# Patient Record
Sex: Male | Born: 1980 | Race: White | Hispanic: No | Smoking: Never smoker
Health system: Southern US, Community
[De-identification: ages and names within clinical notes are randomized; demographics above are authoritative.]

## PROBLEM LIST (undated history)

## (undated) DIAGNOSIS — I10 Essential (primary) hypertension: Secondary | ICD-10-CM

## (undated) HISTORY — DX: Essential (primary) hypertension: I10

---

## 2013-09-17 ENCOUNTER — Encounter (INDEPENDENT_AMBULATORY_CARE_PROVIDER_SITE_OTHER): Payer: Self-pay

## 2013-09-17 ENCOUNTER — Encounter (HOSPITAL_COMMUNITY): Payer: Self-pay | Admitting: Physician Assistant

## 2013-09-17 ENCOUNTER — Ambulatory Visit (INDEPENDENT_AMBULATORY_CARE_PROVIDER_SITE_OTHER): Payer: 59 | Admitting: Physician Assistant

## 2013-09-17 VITALS — BP 150/90 | HR 90 | Ht 74.0 in | Wt 249.0 lb

## 2013-09-17 DIAGNOSIS — F411 Generalized anxiety disorder: Secondary | ICD-10-CM

## 2013-09-17 MED ORDER — BUSPIRONE HCL 30 MG PO TABS: 30.0000 mg | ORAL_TABLET | Freq: Three times a day (TID) | ORAL | Status: DC

## 2013-09-17 MED ORDER — LORAZEPAM 0.5 MG PO TABS: 0.5000 mg | ORAL_TABLET | Freq: Three times a day (TID) | ORAL | Status: DC

## 2013-09-17 NOTE — Patient Instructions (Signed)
Schedule with PCP recommend Dr. Karie Schwalbe. Schedule with Mordecai RasmussenHannah Coble for therapy. Avoid all alcohol. Get daily exercise. Plenty of rest. Follow up in 2-3 weeks.

## 2013-09-17 NOTE — Progress Notes (Signed)
Psychiatric Assessment Adult  Patient Identification:  Juan Mullins Date of Evaluation:  09/17/2013 Chief Complaint: anxiety History of Chief Complaint:   Chief Complaint  Patient presents with  . 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 He states his problems began when he and his wife divorced. They continued to see one and other, and eventually remarried.  He found that she had been prostituting. She confessed and he confronted her about continuing to see a particular client.  They are currently separated a second time and planning to divorce. He also notes that his father has recently been diagnosed with a cancer and is taking chemo therapy. His mother is mentally ill and he hasn't spoken to her in some time.     Juan Mullins notes that he is overwhelmed, anxious, having panic attacks that cause him to be distracted at work. He states he is drinking more to help with the anxiety and does not want to continue this way. HPI Review of Systems  Constitutional: Positive for diaphoresis and unexpected weight change.  HENT: Negative.   Eyes: Negative.   Respiratory: Negative.   Gastrointestinal: Negative.   Endocrine: Negative.   Genitourinary:       Patient has not been checked for STD's and is now having some ED as a result of the stress.  Musculoskeletal: Negative.   Skin: Negative.   Allergic/Immunologic: Negative.   Neurological: Negative.   Hematological: Negative.   Psychiatric/Behavioral: Positive for sleep disturbance, decreased concentration and agitation. The patient is nervous/anxious.    Physical Exam  Psychiatric: His speech is normal and behavior is normal. Judgment and thought content  normal. His mood appears anxious. His affect is angry. Cognition and memory are normal.  Patient is seen.    Depressive Symptoms: anhedonia, insomnia, psychomotor agitation, feelings of worthlessness/guilt, difficulty concentrating, anxiety, panic attacks, weight gain, increased appetite,  (Hypo) Manic Symptoms:   Elevated Mood:  No Irritable Mood:  Yes Grandiosity:  No Distractibility:  Yes Labiality of Mood:  No Delusions:  No Hallucinations:  No Impulsivity:  No Sexually Inappropriate Behavior:  No Financial Extravagance:  No Flight of Ideas:  No  Anxiety Symptoms: Excessive Worry:  Yes Panic Symptoms:  Yes Agoraphobia:  No Obsessive Compulsive: No  Symptoms: None, Specific Phobias:  No Social Anxiety:  No  Psychotic Symptoms:  Hallucinations: No None Delusions:  No Paranoia:  No   Ideas of Reference:  No  PTSD Symptoms: Ever had a traumatic exposure:  No Had a traumatic exposure in the last month:  No Re-experiencing: No None Hypervigilance:  No Hyperarousal: No Difficulty Concentrating Irritability/Anger Avoidance: Yes Decreased Interest/Participation  Traumatic Brain Injury: No   Past Psychiatric History: Diagnosis: None  Hospitalizations:   Outpatient Care:   Substance Abuse Care:   Self-Mutilation: none  Suicidal Attempts:   none  Violent Behaviors:   none   Past Medical History:  History reviewed. No pertinent past medical history. History of Loss of Consciousness:  No Seizure History:  No Cardiac History:  No Allergies:  No Known Allergies Current Medications:  No current outpatient prescriptions on file.   No current facility-administered medications for this visit.    Previous Psychotropic Medications:  Medication Dose  adderall  30mg  but stopped due to side effects                     Substance Abuse History in the last 12 months: Medical Consequences of Substance Abuse:   Legal Consequences of Substance Abuse:    Family Consequences of Substance Abuse:  Blackouts:  No DT's:  No Withdrawal Symptoms:  No None  Social History: [place] Family Members: Parents are alive, 1 sister, 1/2 sister,  Marital Status:  Separated Children: 2 sons  Sons:   Daughters:  Relationships: single Education:  [employer] Educational Problems/Performance: Religious Beliefs/Practices: Christian History of Abuse: none Teacher, musicccupational Experiences; 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 No pertinent family history.  Mental Status Examination/Evaluation: Objective:  Appearance: Fairly Groomed  Eye Contact::  Good  Speech:  Clear and Coherent  Volume:  Normal  Mood:  euthymic  Affect:  Congruent  Thought Process:  Goal Directed  Orientation:  Full (Time, Place, and Person)  Thought Content:  WDL  Suicidal Thoughts:  No  Homicidal Thoughts:  No  Judgement:  Intact  Insight:  Present  Psychomotor Activity:  Normal  Akathisia:  No  Handed:  Right  AIMS (if indicated):    Assets:  Communication Skills Desire for Improvement Financial Resources/Insurance Housing Physical Health Resilience Social Support Talents/Skills Transportation Vocational/Educational    Laboratory/X-Ray Psychological Evaluation(s)   Patient will need labs.     Assessment:  Generalized anxiety disorder  AXIS I Anxiety Disorder NOS  AXIS II Deferred  AXIS III History reviewed. No pertinent past medical history.   AXIS IV problems related to social environment  AXIS V 51-60 moderate symptoms   Treatment Plan/Recommendations:  Plan of Care:  Medication management, OPT, establish with PCP, GC/CZ  Laboratory:  CBC Chemistry Profile UDS GC/CZ  Psychotherapy: individual  Medications: Ativan .5mg  , Buspar 10mg  po BID  Routine PRN Medications:  No  Consultations: if  needed  Safety Concerns:  none  Other:     Lloyd HugerNeil T. Mashburn RPAC 9:18 AM 09/17/2013

## 2013-09-18 ENCOUNTER — Telehealth (HOSPITAL_COMMUNITY): Payer: Self-pay

## 2013-09-19 NOTE — Telephone Encounter (Signed)
I called the patient back regarding his anxiety and his prescriptions. He was taking 30mg  of Buspar TID. He is advised to only take one tablet a day.  He is also asked to take 2 of his Ativan 0.5mg  to see how this helps his anxiety.  Juan Mullins is also cautioned to take someone with him when he exchanges his children with his ex-wife and to avoid all confrontation with her during the exchange.  He will follow up as planned.  Rona RavensNeil T. Mashburn RPAC 9:39 AM 09/19/2013

## 2013-09-20 ENCOUNTER — Other Ambulatory Visit (HOSPITAL_COMMUNITY): Payer: Self-pay | Admitting: Physician Assistant

## 2013-09-20 ENCOUNTER — Encounter: Payer: Self-pay | Admitting: Family Medicine

## 2013-09-20 ENCOUNTER — Ambulatory Visit (INDEPENDENT_AMBULATORY_CARE_PROVIDER_SITE_OTHER): Payer: 59 | Admitting: Family Medicine

## 2013-09-20 ENCOUNTER — Telehealth (HOSPITAL_COMMUNITY): Payer: Self-pay

## 2013-09-20 VITALS — BP 153/96 | HR 94 | Ht 74.0 in | Wt 249.0 lb

## 2013-09-20 DIAGNOSIS — I10 Essential (primary) hypertension: Secondary | ICD-10-CM

## 2013-09-20 DIAGNOSIS — E669 Obesity, unspecified: Secondary | ICD-10-CM

## 2013-09-20 DIAGNOSIS — Z202 Contact with and (suspected) exposure to infections with a predominantly sexual mode of transmission: Secondary | ICD-10-CM

## 2013-09-20 DIAGNOSIS — F524 Premature ejaculation: Secondary | ICD-10-CM

## 2013-09-20 DIAGNOSIS — Z13220 Encounter for screening for lipoid disorders: Secondary | ICD-10-CM

## 2013-09-20 HISTORY — DX: Essential (primary) hypertension: I10

## 2013-09-20 MED ORDER — PROPRANOLOL HCL 20 MG PO TABS: 20.0000 mg | ORAL_TABLET | Freq: Two times a day (BID) | ORAL | Status: DC

## 2013-09-20 MED ORDER — TOPIRAMATE 50 MG PO TABS: 50.0000 mg | ORAL_TABLET | Freq: Two times a day (BID) | ORAL | Status: DC

## 2013-09-20 NOTE — Progress Notes (Signed)
CC: Juan Mullins is a 33 y.o. male is here for Establish Care   Subjective: HPI:  Pleasant 33 year old here to establish care  Patient reports that his wife who he is currently in the process of divorcing has been sleeping with other partners recently. He believes that he is at risk of being exposed to STD. Just prior to today's visit he had STD testing downstairs in our lab, results are not currently available at this time. He does not report any specific symptoms attributed to STDs however does report long-standing concerns regarding premature ejaculation. He states that he predictably reaches orgasm and ejaculation within 1 minute after any sexual stimulation. He has tried Zoloft for this however it only provided 3-4 minutes of prolonging ejaculation from the time of sexual stimulation.  No other interventions as of yet. Symptoms are described as moderate to severe in severity and present on a daily basis regardless of who is providing the sexual stimulation.  Patient reports a history of anxiety which he has been trying to control with compulsive stress eating.  Symptoms are present on a daily basis. Nothing particularly makes symptoms better despite starting BuSpar and Ativan last week. No formal exercise routine. He has had unintentional weight gain polyuria basis for the past 2-3 years.  He has no outside blood pressures to report. He states that he believes that his blood pressures have been normotensive when checked last many months ago. Unknown quantitative amount of sodium on a daily basis. He does not take any stimulants or decongestants.  Review of Systems - General ROS: negative for - chills, fever, night sweats, or weight loss Ophthalmic ROS: negative for - decreased vision Psychological ROS: negative for -  depression ENT ROS: negative for - hearing change, nasal congestion, tinnitus or allergies Hematological and Lymphatic ROS: negative for - bleeding problems, bruising or swollen  lymph nodes Breast ROS: negative Respiratory ROS: no cough, shortness of breath, or wheezing Cardiovascular ROS: no chest pain or dyspnea on exertion Gastrointestinal ROS: no abdominal pain, change in bowel habits, or black or bloody stools Genito-Urinary ROS: negative for - genital discharge, genital ulcers, incontinence or abnormal bleeding from genitals Musculoskeletal ROS: negative for - joint pain or muscle pain Neurological ROS: negative for - headaches or memory loss Dermatological ROS: negative for lumps, mole changes, rash and skin lesion changes  History reviewed. No pertinent past medical history.  History reviewed. No pertinent past surgical history. Family History  Problem Relation Age of Onset  . Squamous cell carcinoma      History   Social History  . Marital Status: Legally Separated    Spouse Name: N/A    Number of Children: N/A  . Years of Education: N/A   Occupational History  . Not on file.   Social History Main Topics  . Smoking status: Never Smoker   . Smokeless tobacco: Not on file  . Alcohol Use: 10.0 oz/week    20 drink(s) per week  . Drug Use: No  . Sexual Activity: Yes    Partners: Female   Other Topics Concern  . Not on file   Social History Narrative  . No narrative on file     Objective: BP 153/96  Pulse 94  Ht 6\' 2"  (1.88 m)  Wt 249 lb (112.946 kg)  BMI 31.96 kg/m2  General: Alert and Oriented, No Acute Distress HEENT: Pupils equal, round, reactive to light. Conjunctivae clear. Moist mucous membranes pharynx unremarkable Lungs: Clear to auscultation bilaterally, no wheezing/ronchi/rales.  Comfortable work of breathing. Good air movement. Cardiac: Regular rate and rhythm. Normal S1/S2.  No murmurs, rubs, nor gallops.   Abdomen: Obese soft nontender Extremities: No peripheral edema.  Strong peripheral pulses.  Mental Status: No depression, anxiety, nor agitation. Skin: Warm and dry.  Assessment & Plan: Juan Mullins was seen today for  establish care.  Diagnoses and associated orders for this visit:  Essential hypertension, benign - propranolol (INDERAL) 20 MG tablet; Take 1-2 tablets (20-40 mg total) by mouth 2 (two) times daily.  Possible exposure to STD  Obesity - topiramate (TOPAMAX) 50 MG tablet; Take 1 tablet (50 mg total) by mouth 2 (two) times daily.  Lipid screening - Lipid panel  Premature ejaculation    Premature ejaculation: Discussed that he is switched to a SSRI/SNRI by his psychiatrist this will likely help. Beta blockers are known to cause sexual side effects such as relative impotence, hopefully started on propranolol this will help prolong the time between initiation of sexual stimulation to ejaculation Possible exposure to STD: Labs are pending Obesity: He is not a candidate for phentermine due to anxiety and uncontrolled blood pressure, we will try Topamax Essential hypertension: Uncontrolled chronic condition starting propranolol we'll titrate as needed Due for annual dyslipidemia screening  Return in about 4 weeks (around 10/18/2013) for BP.

## 2013-09-23 ENCOUNTER — Telehealth (HOSPITAL_COMMUNITY): Payer: Self-pay

## 2013-09-25 ENCOUNTER — Telehealth (HOSPITAL_COMMUNITY): Payer: Self-pay

## 2013-09-25 ENCOUNTER — Encounter (HOSPITAL_COMMUNITY): Payer: Self-pay | Admitting: Psychiatry

## 2013-09-25 ENCOUNTER — Ambulatory Visit (INDEPENDENT_AMBULATORY_CARE_PROVIDER_SITE_OTHER): Payer: 59 | Admitting: Psychiatry

## 2013-09-25 VITALS — BP 107/76 | HR 61 | Ht 74.0 in | Wt 249.0 lb

## 2013-09-25 DIAGNOSIS — F4323 Adjustment disorder with mixed anxiety and depressed mood: Secondary | ICD-10-CM

## 2013-09-25 DIAGNOSIS — F411 Generalized anxiety disorder: Secondary | ICD-10-CM

## 2013-09-25 MED ORDER — CLONAZEPAM 0.5 MG PO TABS: 0.5000 mg | ORAL_TABLET | Freq: Every day | ORAL | Status: DC

## 2013-09-25 MED ORDER — BUPROPION HCL ER (SR) 150 MG PO TB12: 150.0000 mg | ORAL_TABLET | Freq: Every day | ORAL | Status: DC

## 2013-09-25 NOTE — Progress Notes (Signed)
Patient ID: Juan SilviusJustin Fenster, male   DOB: Mar 21, 1981, 33 y.o.   MRN: [id number] [facility]  Juan Mullins [id number] 33 y.o.  09/25/2013  Chief Complaint: Anxiety and depression     History of Present Illness:   Patient returns for Medication Follow up and is diagnosed with Anxiety possible Generalized anxiety disorder. Patient was recently started on Ativan and buspirone. He felt the medications have not been working he was seen by Lloyd HugerNeil but wants to change to provider for which she is made an appointment with me.  He endorses having racing thoughts getting frustrated easily disturbed sleep at night feeling anxious and panicky and sweating at night.  Aggravating factors separation of his life. He keeps his kids over the weekend age 527 and 1033. It is difficult for him to see his wife dating anybody else. He states that they grew apart and they were married young. Next  Modifying factors. Promotion to a managerial level. This may lead [place] to go [place].  Context. Is also premature ejaculation. He was started on Zoloft in the past which did not help. Started on Inderal which apparently also is not helping him. He feels anxious when he has to perform. He was to be on some medication does not cause further side effects.  Severity of anxiety and depression 5/10  Duration 3-4 years. He wants to me on med that will be beneficial to sexual side effects and not cause weight gain either. No past medical history on file. Family History  Problem Relation Age of Onset  . Squamous cell carcinoma      Outpatient Encounter Prescriptions as of 09/25/2013  Medication Sig  . buPROPion (WELLBUTRIN SR) 150 MG 12 hr tablet Take 1 tablet (150 mg total) by mouth daily.  . clonazePAM (KLONOPIN) 0.5 MG tablet Take 1 tablet (0.5 mg total) by mouth at bedtime.  . propranolol (INDERAL) 20 MG tablet Take 1-2 tablets (20-40 mg total) by mouth 2 (two) times daily.  Marland Kitchen. topiramate  (TOPAMAX) 50 MG tablet Take 1 tablet (50 mg total) by mouth 2 (two) times daily.  . [DISCONTINUED] busPIRone (BUSPAR) 30 MG tablet Take 10 mg by mouth 3 (three) times daily.  . [DISCONTINUED] LORazepam (ATIVAN) 0.5 MG tablet Take 1 tablet (0.5 mg total) by mouth every 8 (eight) hours.    No results found for this or any previous visit (from the past 2160 hour(s)).  BP 107/76  Pulse 61  Ht 6\' 2"  (1.88 m)  Wt 249 lb (112.946 kg)  BMI 31.96 kg/m2   Review of Systems  Constitutional: Negative.   HENT: Negative.   Gastrointestinal: Negative for heartburn and nausea.  Skin: Negative.   Psychiatric/Behavioral: Positive for depression. The patient is nervous/anxious and has insomnia.     Mental Status Examination  Appearance: casual Alert: Yes Attention: fair  Cooperative: Yes Eye Contact: Fair Speech: normal tone Psychomotor Activity: Decreased Memory/Concentration: adequate Oriented: person, place, time/date and situation Mood: Anxious and Dysphoric Affect: Constricted Thought Processes and Associations: Linear Fund of Knowledge: Fair Thought Content: Suicidal ideation and Homicidal ideation were denied Insight: Fair Judgement: Fair  Diagnosis:  generalized anxiety disorder. Adjustment disorder with depressed mood anxiety   Treatment Plan:    discontinue Ativan discontinue BuSpar. Start on Wellbutrin 1 mg SR discussed the side effects with possible increase in anxiety in the beginning for which I will start him on Klonopin at night small little 0.5 mg on a temporary basis. I do recommend lowering  the dose of Inderal as it can cause depression. I do recommend that he stops drinking he has been drinking 4-5 beers every night or every other night informed the medications take 2-4 weeks to build up and they will not work unless he stops drinking.  Pertinent Labs and Relevant Prior Notes reviewed. Medication Side effects, benefits and risks reviewed/discussed with Patient. Time  given for patient to respond and asks questions regarding the Diagnosis and Medications. Safety concerns and to report to ER if suicidal or call 911. Relevant Medications refilled or called in to pharmacy. Discussed weight maintenance and Sleep Hygiene. Follow up with Primary care provider in regards to Medical conditions. Recommend compliance with medications and follow up office appointments. Discussed to avail opportunity to consider or/and continue Individual therapy with Counselor. Greater than 50% of time was spend in counseling and coordination of care with the patient.  Schedule for Follow up visit in  4 weeks  or call in earlier as necessary.  Thresa RossAKHTAR, Carole Doner, MD 09/25/2013

## 2013-09-26 ENCOUNTER — Telehealth (HOSPITAL_COMMUNITY): Payer: Self-pay

## 2013-09-26 DIAGNOSIS — F411 Generalized anxiety disorder: Secondary | ICD-10-CM

## 2013-09-26 DIAGNOSIS — Z7251 High risk heterosexual behavior: Secondary | ICD-10-CM

## 2013-09-26 NOTE — Telephone Encounter (Signed)
Called home there was answering machine. When he calls back need to know what results is he inquiring as we didn't order any labs or if primary care would need to call him back.

## 2013-09-26 NOTE — Telephone Encounter (Signed)
Checked and found that last orders were not put right. Talked with [facility] they would need new orders and the patient to come back in. Verne SpurrNeil Mashburn putting new orders. Informed patient. Kim to call him once ordered are placed so he can get the request forms.

## 2013-10-21 ENCOUNTER — Ambulatory Visit: Payer: Self-pay | Admitting: Family Medicine

## 2013-12-04 ENCOUNTER — Other Ambulatory Visit: Payer: Self-pay | Admitting: Family Medicine

## 2014-10-31 ENCOUNTER — Encounter (HOSPITAL_COMMUNITY): Payer: Self-pay | Admitting: Psychiatry

## 2014-10-31 ENCOUNTER — Ambulatory Visit (INDEPENDENT_AMBULATORY_CARE_PROVIDER_SITE_OTHER): Payer: 59 | Admitting: Psychiatry

## 2014-10-31 VITALS — BP 124/84 | HR 80 | Ht 74.0 in | Wt 224.0 lb

## 2014-10-31 DIAGNOSIS — G4700 Insomnia, unspecified: Secondary | ICD-10-CM | POA: Diagnosis not present

## 2014-10-31 DIAGNOSIS — F900 Attention-deficit hyperactivity disorder, predominantly inattentive type: Secondary | ICD-10-CM

## 2014-10-31 DIAGNOSIS — F411 Generalized anxiety disorder: Secondary | ICD-10-CM | POA: Diagnosis not present

## 2014-10-31 DIAGNOSIS — F4323 Adjustment disorder with mixed anxiety and depressed mood: Secondary | ICD-10-CM

## 2014-10-31 MED ORDER — ATOMOXETINE HCL 25 MG PO CAPS: 25.0000 mg | ORAL_CAPSULE | Freq: Every day | ORAL | Status: DC

## 2014-10-31 MED ORDER — FLUOXETINE HCL 20 MG PO TABS: 20.0000 mg | ORAL_TABLET | Freq: Every day | ORAL | Status: DC

## 2014-10-31 MED ORDER — CLONAZEPAM 1 MG PO TABS: 1.0000 mg | ORAL_TABLET | Freq: Every day | ORAL | Status: DC | PRN

## 2014-10-31 NOTE — Progress Notes (Signed)
Patient ID: Juan Mullins, male   DOB: [birth year], 34 y.o.   MRN: [id number] [facility] Re Establish care after one year  Juan Mullins [id number] 34 y.o.  10/31/2014  Chief Complaint: Anxiety and ADD.      History of Present Illness:   Patient  Is a 34 years old currently single Caucasian male. He is returning after 1 year. He has been diagnosed. Generalized anxiety disorder.  Says that they moved to [place] and he is now divorced he is now coming back and live in [place]. He has been diagnosed with ADD with a primary care physician at [facility] also being given medication of Klonopin for anxiety. He has had sleep disturbance when he was on Adderall since it was a strong medication but it was helping his ADD symptoms   States without medication is feeling distracted get overwhelmed easily cannot focus. Cannot do multitasking. Loses history in of thoughts and has difficulty performing his work. He says he would like to get back on some medication that would help his attention but may not be as strong as Adderall . In regarding to anxiety still worries sometimes excessive but says that he does have a girlfriend and is not worried about the relationship as he has been in the past. He worries about his illnesses and his job. His worries can keep him up.  Does not endorse hopelessness helplessness or significant depressive symptoms says that he does feel down. They're both not feeling down on a day-to-day basis   He endorses having racing thoughts getting frustrated easily disturbed sleep at night feeling anxious at night. Takes Klonopin when necessary and that helps him  Aggravating factors separation of his life.now divorced and that problem is settled down. .multi tasking and overwhelm at work Modifying factors. Promotion to a managerial level. Supportive girl friend.   Context. In attentiion, over stress if cannot perform at work. History of pre mature ejaculation as stressSeverity of  anxiety and depression 5/10  Duration 3-4 years.  He wants to me on med that will be beneficial to sexual side effects and not cause weight gain either.  Severity of anxiety: 6/10. 10 being extreme.   No past medical history on file. Family History  Problem Relation Age of Onset  . Squamous cell carcinoma      Outpatient Encounter Prescriptions as of 10/31/2014  Medication Sig  . clonazePAM (KLONOPIN) 1 MG tablet Take 1 tablet (1 mg total) by mouth daily as needed for anxiety.  . [DISCONTINUED] clonazePAM (KLONOPIN) 0.5 MG tablet Take 1 tablet (0.5 mg total) by mouth at bedtime.  . [DISCONTINUED] topiramate (TOPAMAX) 50 MG tablet TAKE 1 TABLET (50 MG TOTAL) BY MOUTH 2 (TWO) TIMES DAILY.  Marland Kitchen atomoxetine (STRATTERA) 25 MG capsule Take 1 capsule (25 mg total) by mouth daily.  Marland Kitchen FLUoxetine (PROZAC) 20 MG tablet Take 1 tablet (20 mg total) by mouth daily.  . [DISCONTINUED] buPROPion (WELLBUTRIN SR) 150 MG 12 hr tablet Take 1 tablet (150 mg total) by mouth daily. (Patient not taking: Reported on 10/31/2014)  . [DISCONTINUED] propranolol (INDERAL) 20 MG tablet Take 1-2 tablets (20-40 mg total) by mouth 2 (two) times daily. (Patient not taking: Reported on 10/31/2014)   No facility-administered encounter medications on file as of 10/31/2014.    No results found for this or any previous visit (from the past 2160 hour(s)).  BP 124/84 mmHg  Pulse 80  Ht 6\' 2"  (1.88 m)  Wt 224 lb (101.606 kg)  BMI 28.75  kg/m2  SpO2 97%   Social History: Single has girl friend. He is recently divorced. Works at a [employer].  Substance Abuse : history of drinking beer 3 to 4 but denies recent use of alcohol or drugs.   Review of Systems  Constitutional: Negative.   HENT: Negative.   Gastrointestinal: Negative for heartburn and nausea.  Skin: Negative.   Psychiatric/Behavioral: Positive for depression. The patient is nervous/anxious and has insomnia.     Mental Status Examination  Appearance:  casual Alert: Yes Attention: fair  Cooperative: Yes Eye Contact: Fair Speech: normal tone Psychomotor Activity: Decreased Memory/Concentration: adequate Oriented: person, place, time/date and situation Mood: anxious  Affect: Constricted Thought Processes and Associations: Linear Fund of Knowledge: Fair Thought Content: Suicidal ideation and Homicidal ideation were denied Insight: Fair Judgement: Fair  Diagnosis:  generalized anxiety disorder. Adjustment disorder with depressed mood anxiety . ADD, inattentive type.insomnia  Treatment Plan:   Avoid or continue to abstain from alcohol Klonopine use sparingly  qd prn. 15 tablets per month Add prozac  for GAD and at time he binges for food. HOld off adderall Start Strattera  qd for ADD if needed can increase. If remains overwhelmed can consdier low dose of adderall or similar medication.   Pertinent Labs and Relevant Prior Notes reviewed. Medication Side effects, benefits and risks reviewed/discussed with Patient. Time given for patient to respond and asks questions regarding the Diagnosis and Medications. Safety concerns and to report to ER if suicidal or call 911. Relevant Medications refilled or called in to pharmacy. Discussed weight maintenance and Sleep Hygiene. Follow up with Primary care provider in regards to Medical conditions. Recommend compliance with medications and follow up office appointments. Discussed to avail opportunity to consider or/and continue Individual therapy with Counselor. Greater than 50% of time was spend in counseling and coordination of care with the patient.  Schedule for Follow up visit in  4 weeks  or call in earlier as necessary.  Thresa Ross, MD 10/31/2014

## 2014-12-09 ENCOUNTER — Ambulatory Visit (INDEPENDENT_AMBULATORY_CARE_PROVIDER_SITE_OTHER): Payer: 59 | Admitting: Psychiatry

## 2014-12-09 ENCOUNTER — Encounter (HOSPITAL_COMMUNITY): Payer: Self-pay | Admitting: Psychiatry

## 2014-12-09 VITALS — BP 128/85 | HR 80 | Ht 74.0 in | Wt 224.0 lb

## 2014-12-09 DIAGNOSIS — F4323 Adjustment disorder with mixed anxiety and depressed mood: Secondary | ICD-10-CM | POA: Diagnosis not present

## 2014-12-09 DIAGNOSIS — F411 Generalized anxiety disorder: Secondary | ICD-10-CM

## 2014-12-09 DIAGNOSIS — G4700 Insomnia, unspecified: Secondary | ICD-10-CM

## 2014-12-09 DIAGNOSIS — F900 Attention-deficit hyperactivity disorder, predominantly inattentive type: Secondary | ICD-10-CM | POA: Diagnosis not present

## 2014-12-09 MED ORDER — AMPHETAMINE-DEXTROAMPHET ER 10 MG PO CP24: ORAL_CAPSULE | ORAL | Status: DC

## 2014-12-09 MED ORDER — CLONAZEPAM 1 MG PO TABS: 1.0000 mg | ORAL_TABLET | Freq: Every day | ORAL | Status: DC | PRN

## 2014-12-09 NOTE — Progress Notes (Signed)
Patient ID: Juan Mullins, male   DOB: 1981-03-11, 34 y.o.   MRN: [id number] [name] Outpatient follow up visit  Juan Mullins [id number] 34 y.o.  12/09/2014  Chief Complaint: Anxiety and ADD.    History of Present Illness:   Patient  Is a 34 years old currently single Caucasian male. He is returning after 1 year. He has been diagnosed. Generalized anxiety disorder.  Initially presented with " that they moved to [place] and he is now divorced he is now coming back and live here. He has been diagnosed with ADD with a primary care [facility] at [facility] also being given medication of Klonopin for anxiety. He has had sleep disturbance when he was on Adderall since it was a strong medication but it was helping his ADD symptoms "  ADD; did not tolerate Strattera. Still distracted and has gone worse I stopped it Anxiety; generalized worries and also performance anxiety. 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 Prozac was also started for depression and anxiety but he was not able tolerate it because he started gaining weight and feeling jittery  Says Adderall has helped him in the past and would like to get back on that He endorses having racing thoughts getting frustrated easily disturbed sleep at night feeling anxious at night. Takes Klonopin when necessary and that helps him  Aggravating factors separation of his life.now divorced and that problem is settled down. .multi tasking and overwhelm at work Modifying factors. Promotion to a managerial level. Supportive girl friend.   Context. In attentiion, over stress if cannot perform at work. History of pre mature ejaculation as stressSeverity of anxiety and depression 5/10  Duration 3-4 years.  He wants to me on med that will be beneficial to sexual side effects and not cause weight gain either.  Severity  of anxiety: 6/10. 10 being extreme.   No past medical history on file. Family History  Problem Relation Age of Onset  . Squamous cell carcinoma      Outpatient Encounter Prescriptions as of 12/09/2014  Medication Sig  . amphetamine-dextroamphetamine (ADDERALL XR) 10 MG 24 hr capsule ER or XR is fine.  . clonazePAM (KLONOPIN) 1 MG tablet Take 1 tablet (1 mg total) by mouth daily as needed for anxiety.  . [DISCONTINUED] atomoxetine (STRATTERA) 25 MG capsule Take 1 capsule (25 mg total) by mouth daily.  . [DISCONTINUED] clonazePAM (KLONOPIN) 1 MG tablet Take 1 tablet (1 mg total) by mouth daily as needed for anxiety.  . [DISCONTINUED] FLUoxetine (PROZAC) 20 MG tablet Take 1 tablet (20 mg total) by mouth daily.   No facility-administered encounter medications on file as of 12/09/2014.    No results found for this or any previous visit (from the past 2160 hour(s)).  BP 128/85 mmHg  Pulse 80  Ht 6\' 2"  (1.88 m)  Wt 224 lb (101.606 kg)  BMI 28.75 kg/m2   Social History: Single has girl friend. He is recently divorced. Works at a Careers adviser.  Substance Abuse : history of drinking beer 3 to 4 but denies recent use of alcohol or drugs.   Review of Systems  Constitutional: Negative.   Gastrointestinal: Negative for heartburn and nausea.  Skin: Negative for rash.  Neurological: Negative for tremors.  Psychiatric/Behavioral: The patient is nervous/anxious and has insomnia.     Mental Status Examination  Appearance: casual Alert: Yes Attention: fair  Cooperative: Yes Eye Contact: Fair Speech: normal tone Psychomotor Activity: Decreased Memory/Concentration: adequate Oriented: person, place, time/date and situation Mood: somewhat anxious about tasks Affect: Constricted Thought Processes and Associations: Linear Fund of Knowledge: Fair Thought Content: Suicidal ideation and Homicidal ideation were denied Insight: Fair Judgement: Fair  Diagnosis:  generalized anxiety  disorder. Adjustment disorder with depressed mood anxiety . ADD, inattentive type.insomnia  Treatment Plan:   Avoid or continue to abstain from alcohol Klonopine use sparingly  qd prn. Cut down to 10  tablets per month \. Strattera was stopped Start Adderall XR 10 mg for ADHD Continue Klonopin small dose Stranded just to sleep and we discussed an review sleep hygiene   Pertinent Labs and Relevant Prior Notes reviewed. Medication Side effects, benefits and risks reviewed/discussed with Patient. Time given for patient to respond and asks questions regarding the Diagnosis and Medications. Safety concerns and to report to ER if suicidal or call 911. Relevant Medications refilled or called in to pharmacy. Discussed weight maintenance and Sleep Hygiene. Follow up with Primary care provider in regards to Medical conditions. Recommend compliance with medications and follow up office appointments. Discussed to avail opportunity to consider or/and continue Individual therapy with Counselor. Greater than 50% of time was spend in counseling and coordination of care with the patient.  Schedule for Follow up visit in  4 weeks  or call in earlier as necessary.  Time spent: 25 minutes  Thresa Ross, MD 12/09/2014

## 2015-01-05 ENCOUNTER — Ambulatory Visit (INDEPENDENT_AMBULATORY_CARE_PROVIDER_SITE_OTHER): Payer: 59 | Admitting: Psychiatry

## 2015-01-05 ENCOUNTER — Encounter (HOSPITAL_COMMUNITY): Payer: Self-pay | Admitting: Psychiatry

## 2015-01-05 DIAGNOSIS — F900 Attention-deficit hyperactivity disorder, predominantly inattentive type: Secondary | ICD-10-CM

## 2015-01-05 DIAGNOSIS — G4700 Insomnia, unspecified: Secondary | ICD-10-CM

## 2015-01-05 DIAGNOSIS — F411 Generalized anxiety disorder: Secondary | ICD-10-CM | POA: Diagnosis not present

## 2015-01-05 MED ORDER — CLONAZEPAM 1 MG PO TABS: 1.0000 mg | ORAL_TABLET | Freq: Every day | ORAL | Status: DC | PRN

## 2015-01-05 MED ORDER — AMPHETAMINE-DEXTROAMPHET ER 10 MG PO CP24: ORAL_CAPSULE | ORAL | Status: DC

## 2015-01-05 NOTE — Progress Notes (Signed)
Patient ID: Juan Mullins, male   DOB: 02/08/1981, 34 y.o.   MRN: [id number] [facility] Outpatient follow up visit  Juan Mullins [id number] 34 y.o.  01/05/2015  Chief Complaint: Anxiety and ADD.    History of Present Illness:   Patient  Is a 34 years old currently single Caucasian male. Diagnosed with ADHD and  Generalized anxiety disorder.  Initially presented with " that they moved to [place] and he is now divorced he is now coming back and live here. He has been diagnosed with ADD with a primary care physician at [facility] also being given medication of Klonopin for anxiety. He has had sleep disturbance when he was on Adderall since it was a strong medication but it was helping his ADD symptoms "  ADD; did not tolerate Strattera. Still distracted and has gone worse I stopped it. toleratin low dose adderall now.  Anxiety; generalized worries and also performance anxiety. 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 Prozac was also started for depression and anxiety but he was not able tolerate it because he started gaining weight and feeling jittery  Insomnia: He endorses having racing thoughts getting frustrated easily disturbed sleep at night feeling anxious at night. Takes Klonopin when necessary and that helps him  Aggravating factors separation of his life.now divorced and that problem is settled down. .multi tasking and overwhelm at work Modifying factors. Promotion to a managerial level. Supportive girl friend.   Context. In attentiion, over stress if cannot perform at work. History of pre mature ejaculation as stressSeverity of anxiety and depression 5/10  Duration 3-4 years.  Severity of anxiety: 6/10. 10 being extreme.   History reviewed. No pertinent past medical history. Family History  Problem Relation Age of Onset  . Squamous cell  carcinoma      Outpatient Encounter Prescriptions as of 01/05/2015  Medication Sig  . amphetamine-dextroamphetamine (ADDERALL XR) 10 MG 24 hr capsule ER or XR is fine.  . clonazePAM (KLONOPIN) 1 MG tablet Take 1 tablet (1 mg total) by mouth daily as needed for anxiety.  . [DISCONTINUED] amphetamine-dextroamphetamine (ADDERALL XR) 10 MG 24 hr capsule ER or XR is fine.  . [DISCONTINUED] clonazePAM (KLONOPIN) 1 MG tablet Take 1 tablet (1 mg total) by mouth daily as needed for anxiety.   No facility-administered encounter medications on file as of 01/05/2015.    No results found for this or any previous visit (from the past 2160 hour(s)).  There were no vitals taken for this visit.   Social History: Single has girl friend. He is recently divorced. Works at a Careers adviser.  Substance Abuse : history of drinking beer 3 to 4 but denies recent use of alcohol or drugs.   Review of Systems  Constitutional: Negative.   Cardiovascular: Negative for chest pain.  Gastrointestinal: Negative for heartburn and nausea.  Skin: Negative for rash.  Neurological: Negative for tremors.  Psychiatric/Behavioral: The patient has insomnia.     Mental Status Examination  Appearance: casual Alert: Yes Attention: fair  Cooperative: Yes Eye Contact: Fair Speech: normal tone Psychomotor Activity: Decreased Memory/Concentration: adequate Oriented: person, place, time/date and situation Mood: less anxious Affect: Constricted Thought Processes and Associations: Linear Fund of Knowledge: Fair Thought Content: Suicidal ideation and Homicidal ideation were denied Insight: Fair Judgement: Fair  Diagnosis:  generalized anxiety disorder. Adjustment disorder with depressed mood anxiety . ADD, inattentive type.insomnia  Treatment Plan:   Avoid or continue to abstain from alcohol Klonopine use sparingly  qd prn. Cut down to 10 to 15  tablets per month. Helps in performance as well for  presentations. \Wilhemena Durie was stopped ADHD Adderall XR 10 mg. Tolerating well. Renew prescription.   Insomnia: Klonopine sparingly  and we discussed an review sleep hygiene   Pertinent Labs and Relevant Prior Notes reviewed. Medication Side effects, benefits and risks reviewed/discussed with Patient. Time given for patient to respond and asks questions regarding the Diagnosis and Medications. Safety concerns and to report to ER if suicidal or call 911. Relevant Medications refilled or called in to pharmacy. Discussed weight maintenance and Sleep Hygiene. Follow up with Primary care provider in regards to Medical conditions. Recommend compliance with medications and follow up office appointments. Discussed to avail opportunity to consider or/and continue Individual therapy with Counselor. Greater than 50% of time was spend in counseling and coordination of care with the patient.  Schedule for Follow up visit in  4 weeks  or call in earlier as necessary.    Thresa Ross, MD 01/05/2015

## 2015-02-02 ENCOUNTER — Telehealth (HOSPITAL_COMMUNITY): Payer: Self-pay | Admitting: *Deleted

## 2015-02-02 NOTE — Telephone Encounter (Signed)
Pt will need a prescription written for Adderall 10mg  and Klonopin 1mg . Pt will pick up prescriptions on 02/06/15.  Pt has a f/u appt on 03/09/15. Please call once prescriptions are ready.

## 2015-02-04 MED ORDER — AMPHETAMINE-DEXTROAMPHET ER 10 MG PO CP24: ORAL_CAPSULE | ORAL | Status: DC

## 2015-02-04 MED ORDER — CLONAZEPAM 1 MG PO TABS: 1.0000 mg | ORAL_TABLET | Freq: Every day | ORAL | Status: DC | PRN

## 2015-02-04 NOTE — Telephone Encounter (Signed)
adderall and klonopine printed fo rpick up

## 2015-03-03 ENCOUNTER — Telehealth (HOSPITAL_COMMUNITY): Payer: Self-pay | Admitting: *Deleted

## 2015-03-03 NOTE — Telephone Encounter (Signed)
Pt needs a prescription for amphetamine-dextroamphetamine (ADDERALL XR) 10 MG 24 hr capsule and clonazepam (KLONOPIN) 1 MG tablet. Pt will pick up prescription on 03/06/15 after lunch. Pt has a f/u appt on 03/10/15.

## 2015-03-04 MED ORDER — CLONAZEPAM 1 MG PO TABS: 1.0000 mg | ORAL_TABLET | Freq: Every day | ORAL | Status: DC | PRN

## 2015-03-04 MED ORDER — AMPHETAMINE-DEXTROAMPHET ER 10 MG PO CP24: ORAL_CAPSULE | ORAL | Status: DC

## 2015-03-04 NOTE — Telephone Encounter (Signed)
adderall and klonopine printed fo rpick up 

## 2015-03-09 ENCOUNTER — Ambulatory Visit (HOSPITAL_COMMUNITY): Payer: Self-pay | Admitting: Psychiatry

## 2015-03-10 ENCOUNTER — Encounter (HOSPITAL_COMMUNITY): Payer: Self-pay | Admitting: Psychiatry

## 2015-03-10 ENCOUNTER — Ambulatory Visit (INDEPENDENT_AMBULATORY_CARE_PROVIDER_SITE_OTHER): Payer: 59 | Admitting: Psychiatry

## 2015-03-10 ENCOUNTER — Ambulatory Visit (HOSPITAL_COMMUNITY): Payer: Self-pay | Admitting: Psychiatry

## 2015-03-10 VITALS — BP 130/80 | HR 101 | Ht 74.0 in | Wt 232.0 lb

## 2015-03-10 DIAGNOSIS — F418 Other specified anxiety disorders: Secondary | ICD-10-CM

## 2015-03-10 DIAGNOSIS — F411 Generalized anxiety disorder: Secondary | ICD-10-CM

## 2015-03-10 DIAGNOSIS — G4700 Insomnia, unspecified: Secondary | ICD-10-CM | POA: Diagnosis not present

## 2015-03-10 DIAGNOSIS — F900 Attention-deficit hyperactivity disorder, predominantly inattentive type: Secondary | ICD-10-CM

## 2015-03-10 DIAGNOSIS — F419 Anxiety disorder, unspecified: Secondary | ICD-10-CM | POA: Diagnosis not present

## 2015-03-10 MED ORDER — AMPHETAMINE-DEXTROAMPHET ER 10 MG PO CP24: ORAL_CAPSULE | ORAL | Status: DC

## 2015-03-10 MED ORDER — PROPRANOLOL HCL 10 MG PO TABS: 10.0000 mg | ORAL_TABLET | Freq: Every day | ORAL | Status: DC | PRN

## 2015-03-10 NOTE — Progress Notes (Signed)
Patient ID: Juan Mullins, male   DOB: 1980-09-08, 34 y.o.   MRN: [id number] [facility] Out[facility] follow up visit  Juan SilviusJustin Alms [id number] 34 y.o.  03/10/2015  Chief Complaint: Anxiety and ADD.    History of Present Illness:   Patient  Is a [age] years old currently single Caucasian male. Diagnosed with ADHD and  Generalized anxiety disorder.  Initially presented with " that they moved to [place] and he is now divorced he is now coming back and live here. He has been diagnosed with ADD with [facility] also being given medication of Klonopin for anxiety. He has had sleep disturbance when he was on Adderall since it was a strong medication but it was helping his ADD symptoms "  ADD; did not tolerate Strattera. Still distracted and has gone worse I stopped it. toleratin low dose adderall now.  Anxiety; generalized worries and also performance anxiety. Says klonopine makes him sleepy after peformance looking for an alternate now.  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  Insomnia: improved.  Aggravating factors separation of his life.now divorced and that problem is settled down. .multi tasking and overwhelm at work Modifying factors. Promotion to a managerial level. Supportive girl friend.   Context. In attentiion, over stress if cannot perform at work. History of pre mature ejaculation as stressSeverity of anxiety and depression 5/10  Duration 3-4 years.  Severity of anxiety: 6/10. 10 being extreme.   No past medical history on file. Family History  Problem Relation Age of Onset  . Squamous cell carcinoma      Outpatient Encounter Prescriptions as of 03/10/2015  Medication Sig  . [START ON 03/26/2015] amphetamine-dextroamphetamine (ADDERALL XR) 10 MG 24 hr capsule ER or XR is fine.  . clonazePAM (KLONOPIN) 1 MG tablet Take 1 tablet (1  mg total) by mouth daily as needed for anxiety.  . [DISCONTINUED] amphetamine-dextroamphetamine (ADDERALL XR) 10 MG 24 hr capsule ER or XR is fine.  . propranolol (INDERAL) 10 MG tablet Take 1 tablet (10 mg total) by mouth daily as needed.   No facility-administered encounter medications on file as of 03/10/2015.    No results found for this or any previous visit (from the past 2160 hour(s)).  BP 130/80 mmHg  Pulse 101  Ht 6\' 2"  (1.88 m)  Wt 232 lb (105.235 kg)  BMI 29.77 kg/m2  SpO2 97%   Social History: Single has girl friend. He is recently divorced. Works at a Careers advisermanagerial level.  Substance Abuse : history of drinking beer 3 to 4 but denies recent use of alcohol or drugs.   Review of Systems  Constitutional: Negative.   Cardiovascular: Negative for chest pain.  Gastrointestinal: Negative for heartburn and nausea.  Skin: Negative for rash.  Neurological: Negative for tremors.  Psychiatric/Behavioral: Negative for depression and suicidal ideas.    Mental Status Examination  Appearance: casual Alert: Yes Attention: fair  Cooperative: Yes Eye Contact: Fair Speech: normal tone Psychomotor Activity: Decreased Memory/Concentration: adequate Oriented: person, place, time/date and situation Mood: euthymic Affect: Constricted Thought Processes and Associations: Linear Fund of Knowledge: Fair Thought Content: Suicidal ideation and Homicidal ideation were denied Insight: Fair Judgement: Fair  Diagnosis:  generalized anxiety disorder. Adjustment disorder with depressed mood anxiety . ADD, inattentive type.insomnia  Treatment Plan:   Avoid or continue to abstain from alcohol Adhd: continue adderall. Prescription written Insomnia; working on  sleep hygiene. Use : Klonopine sparingly. Performance anxiety: stop klonopine. Add inderal  prn . Explained concerns and side effects  Pertinent Labs and Relevant Prior Notes reviewed. Medication Side effects, benefits and risks  reviewed/discussed with Patient. Time given for patient to respond and asks questions regarding the Diagnosis and Medications. Safety concerns and to report to ER if suicidal or call 911. Relevant Medications refilled or called in to pharmacy. Discussed weight maintenance and Sleep Hygiene. Follow up with Primary care provider in regards to Medical conditions. Recommend compliance with medications and follow up office appointments. Discussed to avail opportunity to consider or/and continue Individual therapy with Counselor. Greater than 50% of time was spend in counseling and coordination of care with the patient.  Schedule for Follow up visit in  4 weeks  or call in earlier as necessary.  Time spent: 25 minutes   Thresa Ross, MD 03/10/2015

## 2015-04-10 ENCOUNTER — Ambulatory Visit (INDEPENDENT_AMBULATORY_CARE_PROVIDER_SITE_OTHER): Payer: 59 | Admitting: Psychiatry

## 2015-04-10 ENCOUNTER — Encounter (HOSPITAL_COMMUNITY): Payer: Self-pay | Admitting: Psychiatry

## 2015-04-10 VITALS — BP 126/68 | HR 87 | Ht 74.0 in | Wt 233.0 lb

## 2015-04-10 DIAGNOSIS — F411 Generalized anxiety disorder: Secondary | ICD-10-CM | POA: Diagnosis not present

## 2015-04-10 DIAGNOSIS — F900 Attention-deficit hyperactivity disorder, predominantly inattentive type: Secondary | ICD-10-CM | POA: Diagnosis not present

## 2015-04-10 DIAGNOSIS — F419 Anxiety disorder, unspecified: Secondary | ICD-10-CM

## 2015-04-10 DIAGNOSIS — G4700 Insomnia, unspecified: Secondary | ICD-10-CM

## 2015-04-10 DIAGNOSIS — F418 Other specified anxiety disorders: Secondary | ICD-10-CM

## 2015-04-10 MED ORDER — AMPHETAMINE-DEXTROAMPHET ER 15 MG PO CP24: ORAL_CAPSULE | ORAL | Status: DC

## 2015-04-10 NOTE — Progress Notes (Signed)
Patient ID: Juan SilviusJustin Mullins, male   DOB: 10-15-1980, 35 y.o.   MRN: [id number] [facility] Outpatient follow up visit  Juan SilviusJustin Mullins [id number] 35 y.o.  04/10/2015  Chief Complaint: Anxiety and ADD.    History of Present Illness:   Patient  Is a 35 years old currently single Caucasian male. Diagnosed with ADHD and  Generalized anxiety disorder.  Initially presented with " that they moved to [place] and he is now divorced he is now coming back and live here. He has been diagnosed with ADD with [facility] also being given medication of Klonopin for anxiety. He has had sleep disturbance when he was on Adderall since it was a strong medication but it was helping his ADD symptoms "  ADD; did not tolerate Strattera. adderall works but he is now the 10 mg dose is not enough and he feels more distracted as a part of the day Anxiety; generalized worries and also performance anxiety. Says klonopine makes him sleepy after peformance looking for an alternate now. 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 Insomnia: improved.  Aggravating factors separation of his wife.now divorced and that problem is settled down. .multi tasking and overwhelm at work exacerbates it Modifying factors. Promotion to a managerial level. Supportive girl friend.   Context. In attentiion, over stress if cannot perform at work. History of pre mature ejaculation as stressSeverity of anxiety and depression 4/10  Duration 3-4 years.  Severity of anxiety: 6/10. 10 being extreme.   History reviewed. No pertinent past medical history. Family History  Problem Relation Age of Onset  . Squamous cell carcinoma      Outpatient Encounter Prescriptions as of 04/10/2015  Medication  Sig  . amphetamine-dextroamphetamine (ADDERALL XR) 15 MG 24 hr capsule ER or XR is fine.  . [DISCONTINUED] amphetamine-dextroamphetamine (ADDERALL XR) 10 MG 24 hr capsule ER or XR is fine. (Patient taking differently: Take 10 mg by mouth daily. ER or XR is fine.)  . [DISCONTINUED] propranolol (INDERAL) 10 MG tablet Take 1 tablet (10 mg total) by mouth daily as needed.  . clonazePAM (KLONOPIN) 1 MG tablet Take 1 tablet (1 mg total) by mouth daily as needed for anxiety. (Patient not taking: Reported on 04/10/2015)   No facility-administered encounter medications on file as of 04/10/2015.    No results found for this or any previous visit (from the past 2160 hour(s)).  BP 126/68 mmHg  Pulse 87  Ht 6\' 2"  (1.88 m)  Wt 233 lb (105.688 kg)  BMI 29.90 kg/m2  SpO2 97%   Social History: Single has girl friend. He is recently divorced. Works at a Careers advisermanagerial level.  Substance Abuse : history of drinking beer 3 to 4 but denies recent use of alcohol or drugs.   Review of Systems  Constitutional: Negative for fever.  Cardiovascular: Negative for chest pain and palpitations.  Gastrointestinal: Negative for heartburn and nausea.  Skin: Negative for rash.  Neurological: Negative for tremors.  Psychiatric/Behavioral: Negative for depression and suicidal ideas.    Mental Status Examination  Appearance: casual Alert: Yes Attention: fair  Cooperative: Yes Eye Contact: Fair Speech: normal tone Psychomotor Activity: Decreased Memory/Concentration: adequate Oriented: person, place, time/date and situation Mood: euthymic Affect: Constricted Thought Processes and Associations:  Linear Fund of Knowledge: Fair Thought Content: Suicidal ideation and Homicidal ideation were denied Insight: Fair Judgement: Fair  Diagnosis:  generalized anxiety disorder. Adjustment disorder with depressed mood anxiety . ADD, inattentive type.insomnia  Treatment Plan:   Avoid or continue to abstain from alcohol Adhd:  continue adderall. Increase to 15mg  xr as its effect weans off. Prescription written Insomnia; working on sleep hygiene. Use : Klonopine sparingly. Performance anxiety: still may try inderal.  Explained concerns and side effects  Pertinent Labs and Relevant Prior Notes reviewed. Medication Side effects, benefits and risks reviewed/discussed with Patient. Time given for patient to respond and asks questions regarding the Diagnosis and Medications. Safety concerns and to report to ER if suicidal or call 911. Relevant Medications refilled or called in to pharmacy. Discussed weight maintenance and Sleep Hygiene. Follow up with Primary care provider in regards to Medical conditions. Recommend compliance with medications and follow up office appointments. Discussed to avail opportunity to consider or/and continue Individual therapy with Counselor. Greater than 50% of time was spend in counseling and coordination of care with the patient.  Schedule for Follow up visit in  4 weeks  or call in earlier as necessary.     Thresa Ross, MD 04/10/2015

## 2015-05-04 ENCOUNTER — Telehealth (HOSPITAL_COMMUNITY): Payer: Self-pay | Admitting: *Deleted

## 2015-05-04 MED ORDER — AMPHETAMINE-DEXTROAMPHET ER 15 MG PO CP24: ORAL_CAPSULE | ORAL | Status: DC

## 2015-05-04 MED ORDER — CLONAZEPAM 1 MG PO TABS: 1.0000 mg | ORAL_TABLET | Freq: Every day | ORAL | Status: DC | PRN

## 2015-05-04 NOTE — Telephone Encounter (Signed)
Pt called for a refill for Adderall and Klonopin. Pt states he is leaving to go out of town and will return on 05/08/15 late evening. Pt is not due for another refill before 05/11/15. Informed pt I will need to speak with Dr. Gilmore Laroche and return telephone call.

## 2015-05-04 NOTE — Telephone Encounter (Signed)
adderall and klonopine printed fo rpick up 

## 2015-05-08 ENCOUNTER — Ambulatory Visit (HOSPITAL_COMMUNITY): Payer: 59 | Admitting: Psychiatry

## 2015-05-20 ENCOUNTER — Ambulatory Visit (INDEPENDENT_AMBULATORY_CARE_PROVIDER_SITE_OTHER): Payer: BLUE CROSS/BLUE SHIELD | Admitting: Family Medicine

## 2015-05-20 ENCOUNTER — Encounter: Payer: Self-pay | Admitting: Family Medicine

## 2015-05-20 VITALS — BP 125/82 | HR 76 | Wt 240.0 lb

## 2015-05-20 DIAGNOSIS — F524 Premature ejaculation: Secondary | ICD-10-CM | POA: Diagnosis not present

## 2015-05-20 DIAGNOSIS — E669 Obesity, unspecified: Secondary | ICD-10-CM

## 2015-05-20 MED ORDER — TOPIRAMATE 100 MG PO TABS: 100.0000 mg | ORAL_TABLET | Freq: Two times a day (BID) | ORAL | Status: DC

## 2015-05-20 MED ORDER — VENLAFAXINE HCL ER 75 MG PO CP24: 75.0000 mg | ORAL_CAPSULE | Freq: Every day | ORAL | Status: DC

## 2015-05-20 NOTE — Progress Notes (Signed)
CC: Juan Mullins is a 35 y.o. male is here for Medication Management   Subjective: HPI:  When I saw him last he was given Topamax. He was taking this on a daily basis and told me it made a great difference with reducing his portions. He was actually able to get down to 200 pounds while taking this medication. Since he stopped the medication he slowly been gaining weight despite going to the gym most days out of the week. He tells me he has difficulty avoiding eating if he is bored. Denies any edema or shortness of breath.he wants he can restart Topamax or take some other medication to help with diet.  He still having difficulty with premature ejaculation. He tells me he last about 60 seconds between the initiation of an erection and reaching orgasm. He tells me this is causing some anxiety and awkwardness with his male partners. He would like another something that can be taken to extend this . Before he reaches orgasm. He denies any other genitourinary complaints.     Review Of Systems Outlined In HPI  Past Medical History  Diagnosis Date  . Essential hypertension, benign 09/20/2013    No past surgical history on file. Family History  Problem Relation Age of Onset  . Squamous cell carcinoma      Social History   Social History  . Marital Status: Legally Separated    Spouse Name: N/A  . Number of Children: N/A  . Years of Education: N/A   Occupational History  . Not on file.   Social History Main Topics  . Smoking status: Never Smoker   . Smokeless tobacco: Not on file  . Alcohol Use: 10.0 oz/week    20 drink(s) per week  . Drug Use: No  . Sexual Activity:    Partners: Female   Other Topics Concern  . Not on file   Social History Narrative     Objective: BP 125/82 mmHg  Pulse 76  Wt 240 lb (108.863 kg)  Vital signs reviewed. General: Alert and Oriented, No Acute Distress HEENT: Pupils equal, round, reactive to light. Conjunctivae clear.  External ears  unremarkable.  Moist mucous membranes. Lungs: Clear and comfortable work of breathing, speaking in full sentences without accessory muscle use. Cardiac: Regular rate and rhythm.  Neuro: CN II-XII grossly intact, gait normal. Extremities: No peripheral edema.  Strong peripheral pulses.  Mental Status: No depression, anxiety, nor agitation. Logical though process. Skin: Warm and dry. Assessment & Plan: Juan Mullins was seen today for medication management.  Diagnoses and all orders for this visit:  Obesity -     topiramate (TOPAMAX) 100 MG tablet; Take 1 tablet (100 mg total) by mouth 2 (two) times daily.  Premature ejaculation -     venlafaxine XR (EFFEXOR XR) 75 MG 24 hr capsule; Take 1 capsule (75 mg total) by mouth daily with breakfast.   Obesity: Seems reasonable to restart on Topamax as he was not getting any side effects and had a fantastic response with portion control. Continue to exercise most days a week. Premature ejaculation: Chronic uncontrolled condition, encourage him to start on Effexor primarily for the very common side effect of prolonged period To reach orgasm.  Return in about 3 months (around 08/17/2015) for Complete Physical Exam.

## 2015-05-29 ENCOUNTER — Ambulatory Visit (INDEPENDENT_AMBULATORY_CARE_PROVIDER_SITE_OTHER): Payer: BLUE CROSS/BLUE SHIELD | Admitting: Family Medicine

## 2015-05-29 ENCOUNTER — Encounter: Payer: Self-pay | Admitting: Family Medicine

## 2015-05-29 VITALS — BP 136/82 | HR 73 | Wt 229.0 lb

## 2015-05-29 DIAGNOSIS — Z202 Contact with and (suspected) exposure to infections with a predominantly sexual mode of transmission: Secondary | ICD-10-CM

## 2015-05-29 DIAGNOSIS — Z23 Encounter for immunization: Secondary | ICD-10-CM | POA: Diagnosis not present

## 2015-05-29 DIAGNOSIS — Z0000 Encounter for general adult medical examination without abnormal findings: Secondary | ICD-10-CM | POA: Diagnosis not present

## 2015-05-29 LAB — COMPLETE METABOLIC PANEL WITH GFR
ALT: 18 U/L (ref 9–46)
AST: 17 U/L (ref 10–40)
Albumin: 4.6 g/dL (ref 3.6–5.1)
Alkaline Phosphatase: 71 U/L (ref 40–115)
BUN: 13 mg/dL (ref 7–25)
CHLORIDE: 105 mmol/L (ref 98–110)
CO2: 19 mmol/L — AB (ref 20–31)
Calcium: 9.3 mg/dL (ref 8.6–10.3)
Creat: 1.12 mg/dL (ref 0.60–1.35)
GFR, EST NON AFRICAN AMERICAN: 86 mL/min (ref >=60)
GLUCOSE: 76 mg/dL (ref 65–99)
Potassium: 3.7 mmol/L (ref 3.5–5.3)
SODIUM: 138 mmol/L (ref 135–146)
TOTAL PROTEIN: 7.3 g/dL (ref 6.1–8.1)
Total Bilirubin: 1.5 mg/dL — ABNORMAL HIGH (ref 0.2–1.2)

## 2015-05-29 LAB — CBC
HEMATOCRIT: 48.7 % (ref 39.0–52.0)
Hemoglobin: 17 g/dL (ref 13.0–17.0)
MCH: 29.8 pg (ref 26.0–34.0)
MCHC: 34.9 g/dL (ref 30.0–36.0)
MCV: 85.4 fL (ref 78.0–100.0)
MPV: 9.9 fL (ref 8.6–12.4)
Platelets: 356 10*3/uL (ref 150–400)
RBC: 5.7 MIL/uL (ref 4.22–5.81)
RDW: 13.3 % (ref 11.5–15.5)
WBC: 11.6 10*3/uL — ABNORMAL HIGH (ref 4.0–10.5)

## 2015-05-29 LAB — LIPID PANEL
Cholesterol: 116 mg/dL — ABNORMAL LOW (ref 125–200)
HDL: 37 mg/dL — AB (ref >=40)
LDL CALC: 68 mg/dL (ref <130)
TRIGLYCERIDES: 57 mg/dL (ref <150)
Total CHOL/HDL Ratio: 3.1 Ratio (ref <=5.0)
VLDL: 11 mg/dL (ref <30)

## 2015-05-29 LAB — HIV ANTIBODY (ROUTINE TESTING W REFLEX): HIV 1&2 Ab, 4th Generation: NONREACTIVE

## 2015-05-29 LAB — HEPATITIS C ANTIBODY: HCV AB: NEGATIVE

## 2015-05-29 NOTE — Progress Notes (Signed)
CC: Juan Mullins is a 35 y.o. male is here for Annual Exam and skin lesion   Subjective: HPI:  Colonoscopy: No current indication Prostate: Discussed screening risks/beneifts with patient today, no current indication.   Influenza Vaccine: Declined Pneumovax: No current indication. Td/Tdap: Tdap Zoster: (Start 33 yo)   Requesting complete physical exam with no complaints  Review of Systems - General ROS: negative for - chills, fever, night sweats, weight gain or weight loss Ophthalmic ROS: negative for - decreased vision Psychological ROS: negative for - anxiety or depression ENT ROS: negative for - hearing change, nasal congestion, tinnitus or allergies Hematological and Lymphatic ROS: negative for - bleeding problems, bruising or swollen lymph nodes Breast ROS: negative Respiratory ROS: no cough, shortness of breath, or wheezing Cardiovascular ROS: no chest pain or dyspnea on exertion Gastrointestinal ROS: no abdominal pain, change in bowel habits, or black or bloody stools Genito-Urinary ROS: negative for - genital discharge, genital ulcers, incontinence or abnormal bleeding from genitals Musculoskeletal ROS: negative for - joint pain or muscle pain Neurological ROS: negative for - headaches or memory loss Dermatological ROS: negative for lumps, mole changes, rash and skin lesion changes  Past Medical History  Diagnosis Date  . Essential hypertension, benign 09/20/2013    No past surgical history on file. Family History  Problem Relation Age of Onset  . Squamous cell carcinoma      Social History   Social History  . Marital Status: Legally Separated    Spouse Name: N/A  . Number of Children: N/A  . Years of Education: N/A   Occupational History  . Not on file.   Social History Main Topics  . Smoking status: Never Smoker   . Smokeless tobacco: Not on file  . Alcohol Use: 10.0 oz/week    20 drink(s) per week  . Drug Use: No  . Sexual Activity:    Partners:  Female   Other Topics Concern  . Not on file   Social History Narrative     Objective: BP 136/82 mmHg  Pulse 73  Wt 229 lb (103.874 kg)  General: No Acute Distress HEENT: Atraumatic, normocephalic, conjunctivae normal without scleral icterus.  No nasal discharge, hearing grossly intact, TMs with good landmarks bilaterally with no middle ear abnormalities, posterior pharynx clear without oral lesions. Neck: Supple, trachea midline, no cervical nor supraclavicular adenopathy. Pulmonary: Clear to auscultation bilaterally without wheezing, rhonchi, nor rales. Cardiac: Regular rate and rhythm.  No murmurs, rubs, nor gallops. No peripheral edema.  2+ peripheral pulses bilaterally. Abdomen: Bowel sounds normal.  No masses.  Non-tender without rebound.  Negative Murphy's sign. MSK: Grossly intact, no signs of weakness.  Full strength throughout upper and lower extremities.  Full ROM in upper and lower extremities.  No midline spinal tenderness. Neuro: Gait unremarkable, CN II-XII grossly intact.  C5-C6 Reflex 2/4 Bilaterally, L4 Reflex 2/4 Bilaterally.  Cerebellar function intact. Skin: No rashes.benign nevus on left neck. Psych: Alert and oriented to person/place/time.  Thought process normal. No anxiety/depression.  Assessment & Plan: Juan Mullins was seen today for annual exam and skin lesion.  Diagnoses and all orders for this visit:  Annual physical exam -     Lipid panel -     COMPLETE METABOLIC PANEL WITH GFR -     CBC -     HIV antibody -     RPR -     GC/Chlamydia Probe Amp -     Hepatitis C antibody -     Tdap vaccine  greater than or equal to 7yo IM  STD exposure -     HIV antibody -     RPR -     GC/Chlamydia Probe Amp -     Hepatitis C antibody   Healthy lifestyle interventions including but not limited to regular exercise, a healthy low fat diet, moderation of salt intake, the dangers of tobacco/alcohol/recreational drug use, nutrition supplementation, and accident  avoidance were discussed with the patient and a handout was provided for future reference.  Understandably he would like to have an STD screening today because his wife cheated on him just prior to their divorce. He does not have any symptoms that are prompting this but he's never been screened before.  Return in about 1 year (around 05/28/2016) for Physical.

## 2015-05-30 LAB — GC/CHLAMYDIA PROBE AMP
CT Probe RNA: NOT DETECTED
GC PROBE AMP APTIMA: NOT DETECTED

## 2015-05-30 LAB — RPR

## 2015-06-04 ENCOUNTER — Encounter (HOSPITAL_COMMUNITY): Payer: Self-pay | Admitting: Psychiatry

## 2015-06-04 ENCOUNTER — Ambulatory Visit (INDEPENDENT_AMBULATORY_CARE_PROVIDER_SITE_OTHER): Payer: BLUE CROSS/BLUE SHIELD | Admitting: Psychiatry

## 2015-06-04 VITALS — BP 126/70 | HR 83 | Ht 74.0 in | Wt 228.0 lb

## 2015-06-04 DIAGNOSIS — G4700 Insomnia, unspecified: Secondary | ICD-10-CM | POA: Diagnosis not present

## 2015-06-04 DIAGNOSIS — F411 Generalized anxiety disorder: Secondary | ICD-10-CM

## 2015-06-04 DIAGNOSIS — F900 Attention-deficit hyperactivity disorder, predominantly inattentive type: Secondary | ICD-10-CM | POA: Diagnosis not present

## 2015-06-04 DIAGNOSIS — F418 Other specified anxiety disorders: Secondary | ICD-10-CM

## 2015-06-04 MED ORDER — AMPHETAMINE-DEXTROAMPHET ER 15 MG PO CP24: ORAL_CAPSULE | ORAL | Status: DC

## 2015-06-04 MED ORDER — CLONAZEPAM 0.5 MG PO TABS: 0.5000 mg | ORAL_TABLET | ORAL | Status: DC | PRN

## 2015-06-04 NOTE — Progress Notes (Signed)
Patient ID: Juan Mullins, male   DOB: 1980-11-06, 35 y.o.   MRN: [id number] [place] [facility] Out[facility] follow up visit  Juan Mullins [id number] 34 y.o.  06/04/2015  Chief Complaint: Anxiety and ADD follow up   History of Present Illness:   Patient  Is a 35 years old currently single Caucasian male. Diagnosed with ADHD and  Generalized anxiety disorder.  Initially presented with " that they moved to [place] and he is now divorced he is now coming back and live here. He has been diagnosed with ADD with a primary care physician at [facility] also being given medication of Klonopin for anxiety. He has had sleep disturbance when he was on Adderall since it was a strong medication but it was helping his ADD symptoms "  ADD; did not tolerate Strattera. adderall works very increasing to 50 mg last visit and that is helping better. He does take drug holidays Anxiety; generalized worries and also performance anxiety. Wants to try Klonopin again we have tried Inderal. He is worried about his performance and presentation do this week. Will start a small dose of Klonopin only if needed Adjustment to dose and job; less distracted and performance better on  adderall. Insomnia: improved.  Aggravating factors separation of his wife.now divorced and that problem is settled down. .multi tasking and overwhelm at work exacerbates it Modifying factors. Promotion to a managerial level. Supportive girl friend.   Context. In attentiion, over stress if cannot perform at work. History of pre mature ejaculation as stressSeverity of  depression 7/10. 10 being no depression.  Duration 3-4 years.  Severity of anxiety: 5/10. 10 being extreme.   Past Medical History  Diagnosis Date  . Essential hypertension, benign 09/20/2013   Family History  Problem Relation Age of Onset  . Squamous cell carcinoma    . Squamous cell carcinoma Father     Outpatient Encounter Prescriptions as of 06/04/2015  Medication Sig  .  amphetamine-dextroamphetamine (ADDERALL XR) 15 MG 24 hr capsule Take one a day  . amphetamine-dextroamphetamine (ADDERALL XR) 15 MG 24 hr capsule Do not fill prior April 2nd, 2017  . topiramate (TOPAMAX) 100 MG tablet Take 1 tablet (100 mg total) by mouth 2 (two) times daily.  Marland Kitchen venlafaxine XR (EFFEXOR XR) 75 MG 24 hr capsule Take 1 capsule (75 mg total) by mouth daily with breakfast.  . [DISCONTINUED] amphetamine-dextroamphetamine (ADDERALL XR) 15 MG 24 hr capsule ER or XR is fine. Do not fill before February 5th 2017  . clonazePAM (KLONOPIN) 0.5 MG tablet Take 1 tablet (0.5 mg total) by mouth as needed for anxiety.   No facility-administered encounter medications on file as of 06/04/2015.    Recent Results (from the past 2160 hour(s))  GC/Chlamydia Probe Amp     Status: None   Collection Time: 05/29/15  9:09 AM  Result Value Ref Range   CT Probe RNA NOT DETECTED     Comment:                    **Normal Reference Range: NOT DETECTED**   This test was performed using the APTIMA COMBO2 Assay ([place].).   The analytical performance characteristics of this assay, when used to test [facility] specimens have been determined by [facility]      GC Probe RNA NOT DETECTED     Comment:                    **Normal Reference Range: NOT DETECTED**  This test was performed using the [place] ([place].).   The analytical performance characteristics of this assay, when used to test [facility] specimens have been determined by [facility]     HIV antibody     Status: None   Collection Time: 05/29/15  9:11 AM  Result Value Ref Range   HIV 1&2 Ab, 4th Generation NONREACTIVE NONREACTIVE    Comment:   HIV-1 antigen and HIV-1/HIV-2 antibodies were not detected.  There is no laboratory evidence of HIV infection.   HIV-1/2 Antibody Diff        Not indicated. HIV-1 RNA, Qual TMA          Not indicated.     PLEASE NOTE: This information has been disclosed to you  from records whose confidentiality may be protected by state law. If your state requires such protection, then the state law prohibits you from making any further disclosure of the information without the specific written consent of the person to whom it pertains, or as otherwise permitted by law. A general authorization for the release of medical or other information is NOT sufficient for this purpose.   The performance of this assay has not been clinically validated in patients less than 74 years old.   For additional information please refer to http://education.[facility].com/faq/FAQ106.  (This link is being provided for informational/educational purposes only.)     RPR     Status: None   Collection Time: 05/29/15  9:11 AM  Result Value Ref Range   RPR Ser Ql NON REAC NON REAC  Hepatitis C antibody     Status: None   Collection Time: 05/29/15  9:11 AM  Result Value Ref Range   HCV Ab NEGATIVE NEGATIVE  Lipid panel     Status: Abnormal   Collection Time: 05/29/15  9:12 AM  Result Value Ref Range   Cholesterol 116 (L) 125 - 200 mg/dL   Triglycerides 57 <150 mg/dL   HDL 37 (L) >=40 mg/dL   Total CHOL/HDL Ratio 3.1 <=5.0 Ratio   VLDL 11 <30 mg/dL   LDL Cholesterol 68 <130 mg/dL    Comment:   Total Cholesterol/HDL Ratio:CHD Risk                        Coronary Heart Disease Risk Table                                        Men       Women          1/2 Average Risk              3.4        3.3              Average Risk              5.0        4.4           2X Average Risk              9.6        7.1           3X Average Risk             23.4       11.0 Use the calculated Patient Ratio above and the CHD Risk table  to determine  the patient's CHD Risk.   COMPLETE METABOLIC PANEL WITH GFR     Status: Abnormal   Collection Time: 05/29/15  9:12 AM  Result Value Ref Range   Sodium 138 135 - 146 mmol/L   Potassium 3.7 3.5 - 5.3 mmol/L   Chloride 105 98 - 110 mmol/L   CO2  19 (L) 20 - 31 mmol/L   Glucose, Bld 76 65 - 99 mg/dL   BUN 13 7 - 25 mg/dL   Creat 1.12 0.60 - 1.35 mg/dL   Total Bilirubin 1.5 (H) 0.2 - 1.2 mg/dL   Alkaline Phosphatase 71 40 - 115 U/L   AST 17 10 - 40 U/L   ALT 18 9 - 46 U/L   Total Protein 7.3 6.1 - 8.1 g/dL   Albumin 4.6 3.6 - 5.1 g/dL   Calcium 9.3 8.6 - 10.3 mg/dL   GFR, Est African American >89 >=60 mL/min   GFR, Est Non African American 86 >=60 mL/min    Comment:   The estimated GFR is a calculation valid for adults (>=33 years old) that uses the CKD-EPI algorithm to adjust for age and sex. It is   not to be used for children, pregnant women, hospitalized patients,    patients on dialysis, or with rapidly changing kidney function. According to the NKDEP, eGFR >89 is normal, 60-89 shows mild impairment, 30-59 shows moderate impairment, 15-29 shows severe impairment and <15 is ESRD.     CBC     Status: Abnormal   Collection Time: 05/29/15  9:12 AM  Result Value Ref Range   WBC 11.6 (H) 4.0 - 10.5 K/uL   RBC 5.70 4.22 - 5.81 MIL/uL   Hemoglobin 17.0 13.0 - 17.0 g/dL   HCT 48.7 39.0 - 52.0 %   MCV 85.4 78.0 - 100.0 fL   MCH 29.8 26.0 - 34.0 pg   MCHC 34.9 30.0 - 36.0 g/dL   RDW 13.3 11.5 - 15.5 %   Platelets 356 150 - 400 K/uL   MPV 9.9 8.6 - 12.4 fL    BP 126/70 mmHg  Pulse 83  Ht 6' 2"  (1.88 m)  Wt 228 lb (103.42 kg)  BMI 29.26 kg/m2  SpO2 98%   Social History: Single has girl friend. He is recently divorced. Works at a [employer] development [employer].  Substance Abuse : history of drinking beer 3 to 4 but denies recent use of alcohol or drugs.   Review of Systems  Constitutional: Negative for fever.  Cardiovascular: Negative for chest pain and palpitations.  Gastrointestinal: Negative for heartburn and nausea.  Skin: Negative for rash.  Neurological: Negative for tremors.  Psychiatric/Behavioral: Negative for depression and suicidal ideas.    Mental Status Examination  Appearance: casual Alert: Yes Attention:  fair  Cooperative: Yes Eye Contact: Fair Speech: normal tone Psychomotor Activity: Decreased Memory/Concentration: adequate Oriented: person, place, time/date and situation Mood: euthymic Affect: Constricted Thought Processes and Associations: Linear Fund of Knowledge: Fair Thought Content: Suicidal ideation and Homicidal ideation were denied Insight: Fair Judgement: Fair  Diagnosis:  generalized anxiety disorder. Adjustment disorder with depressed mood anxiety . ADD, inattentive type.insomnia  Treatment Plan:   Avoid or continue to abstain from alcohol Adhd: continue adderall.  47m xr . Prescription written Insomnia; working on sleep hygiene. Use : Klonopine sparingly. Renewed 10 tablets for performance anxiety and panic. Performance anxiety:see above. Explained concerns and side effects  Pertinent Labs and Relevant Prior Notes reviewed. Medication Side effects, benefits and risks reviewed/discussed with Patient. Time given  for patient to respond and asks questions regarding the Diagnosis and Medications. Safety concerns and to report to ER if suicidal or call 911. Relevant Medications refilled or called in to pharmacy. Discussed weight maintenance and Sleep Hygiene. Follow up with Primary care provider in regards to Medical conditions. Recommend compliance with medications and follow up office appointments. Discussed to avail opportunity to consider or/and continue Individual therapy with Counselor. Greater than 50% of time was spend in counseling and coordination of care with the patient.  Schedule for Follow up visit in  4 to 6 weeks  or call in earlier as necessary. Time spent: 25 minutes     Merian Capron, MD 06/04/2015

## 2015-06-26 ENCOUNTER — Telehealth: Payer: Self-pay

## 2015-06-26 MED ORDER — PHENTERMINE HCL 30 MG PO TBDP: ORAL_TABLET | ORAL | Status: DC

## 2015-06-26 NOTE — Telephone Encounter (Signed)
Pt reports that he stopped taking Effexor and that the Topamax was working in the beginning but now he isn't seeing any results.  Any suggestions?

## 2015-06-26 NOTE — Telephone Encounter (Signed)
Pt.notified

## 2015-06-26 NOTE — Telephone Encounter (Signed)
Switch topamax to phentermine, Rx in your in box.

## 2015-08-06 ENCOUNTER — Ambulatory Visit (HOSPITAL_COMMUNITY): Payer: Self-pay | Admitting: Psychiatry

## 2015-08-10 ENCOUNTER — Telehealth (HOSPITAL_COMMUNITY): Payer: Self-pay | Admitting: Psychiatry

## 2015-08-11 ENCOUNTER — Ambulatory Visit (HOSPITAL_COMMUNITY): Payer: Self-pay | Admitting: Psychiatry

## 2015-08-14 ENCOUNTER — Ambulatory Visit (INDEPENDENT_AMBULATORY_CARE_PROVIDER_SITE_OTHER): Payer: BLUE CROSS/BLUE SHIELD | Admitting: Psychiatry

## 2015-08-14 ENCOUNTER — Encounter (HOSPITAL_COMMUNITY): Payer: Self-pay | Admitting: Psychiatry

## 2015-08-14 DIAGNOSIS — F411 Generalized anxiety disorder: Secondary | ICD-10-CM

## 2015-08-14 DIAGNOSIS — F418 Other specified anxiety disorders: Secondary | ICD-10-CM

## 2015-08-14 DIAGNOSIS — F419 Anxiety disorder, unspecified: Secondary | ICD-10-CM

## 2015-08-14 DIAGNOSIS — F900 Attention-deficit hyperactivity disorder, predominantly inattentive type: Secondary | ICD-10-CM | POA: Diagnosis not present

## 2015-08-14 DIAGNOSIS — G4700 Insomnia, unspecified: Secondary | ICD-10-CM

## 2015-08-14 MED ORDER — LORAZEPAM 0.5 MG PO TABS: 0.5000 mg | ORAL_TABLET | Freq: Every day | ORAL | Status: DC | PRN

## 2015-08-14 MED ORDER — AMPHETAMINE-DEXTROAMPHET ER 10 MG PO CP24: ORAL_CAPSULE | ORAL | Status: DC

## 2015-08-14 NOTE — Progress Notes (Signed)
Patient ID: Juan Mullins, male   DOB: 01-11-1981, 35 y.o.   MRN: [id number] [facility] Outpatient follow up visit  Juan Mullins [id number] 35 y.o.  08/14/2015  Chief Complaint: Anxiety and ADD follow up   History of Present Illness:   Patient  Is a 35 years old currently single Caucasian male. Diagnosed with ADHD and  Generalized anxiety disorder.  Initially presented with " that they moved to [place] and he is now divorced he is now coming back and live here. He has been diagnosed with ADD with a primary care physician"  ADD; he feels 15 mg maybe a little higher dose and he needed. Somewhat anxious at times also has difficulty performing what he has to speak. Anxiety; generalized worries and also performance anxiety. klonopine make him somewhat sluggish that was started earlier. Adjustment to dose and job; less distracted and performance better on adderall. Insomnia: improved.  Medical complexity: somewhat concern about erectile function. Primary care gave him effexor but it made him sedate so he stopped it. Talked about options later if need. Has been on phenteramine for weight loss now stopped as well Working on activity level , goes to gym now and has stopped alcohol.  Aggravating factors separation of his wife.now divorced and that problem is settled down. .multi tasking and travel Modifying factors. Promotion to a managerial level.  Context. In attentiion, over stress if cannot perform at work. History of pre mature ejaculation as stress Severity of  depression 7/10. 10 being no depression.  Duration 3-4 years.  Severity of anxiety: 5/10. 10 being extreme.   Past Medical History  Diagnosis Date  . Essential hypertension, benign 09/20/2013   Family History  Problem Relation Age of Onset  . Squamous cell carcinoma    . Squamous cell carcinoma Father     Outpatient Encounter Prescriptions as of 08/14/2015  Medication Sig  . amphetamine-dextroamphetamine  (ADDERALL XR) 10 MG 24 hr capsule Take one a day  . LORazepam (ATIVAN) 0.5 MG tablet Take 1 tablet (0.5 mg total) by mouth daily as needed for anxiety.  . [DISCONTINUED] amphetamine-dextroamphetamine (ADDERALL XR) 15 MG 24 hr capsule Take one a day  . [DISCONTINUED] clonazePAM (KLONOPIN) 0.5 MG tablet Take 1 tablet (0.5 mg total) by mouth as needed for anxiety.  . [DISCONTINUED] Phentermine HCl 30 MG TBDP Half to full tablet every morning for appetite suppression. Nurse visit for weight check needed for refills.   No facility-administered encounter medications on file as of 08/14/2015.    Recent Results (from the past 2160 hour(s))  GC/Chlamydia Probe Amp     Status: None   Collection Time: 05/29/15  9:09 AM  Result Value Ref Range   CT Probe RNA NOT DETECTED     Comment:                    **Normal Reference Range: NOT DETECTED**   This test was performed using the APTIMA COMBO2 Assay ([place].).   The analytical performance characteristics of this assay, when used to test SurePath specimens have been determined by [facility]      GC Probe RNA NOT DETECTED     Comment:                    **Normal Reference Range: NOT DETECTED**   This test was performed using the APTIMA COMBO2 Assay ([place].).   The analytical performance characteristics of this assay, when used to test SurePath specimens have  been determined by [facility]     HIV antibody     Status: None   Collection Time: 05/29/15  9:11 AM  Result Value Ref Range   HIV 1&2 Ab, 4th Generation NONREACTIVE NONREACTIVE    Comment:   HIV-1 antigen and HIV-1/HIV-2 antibodies were not detected.  There is no laboratory evidence of HIV infection.   HIV-1/2 Antibody Diff        Not indicated. HIV-1 RNA, Qual TMA          Not indicated.     PLEASE NOTE: This information has been disclosed to you from records whose confidentiality may be protected by state law. If your state requires such protection,  then the state law prohibits you from making any further disclosure of the information without the specific written consent of the person to whom it pertains, or as otherwise permitted by law. A general authorization for the release of medical or other information is NOT sufficient for this purpose.   The performance of this assay has not been clinically validated in patients less than 56 years old.   For additional information please refer to http://education.[facility].com/faq/FAQ106.  (This link is being provided for informational/educational purposes only.)     RPR     Status: None   Collection Time: 05/29/15  9:11 AM  Result Value Ref Range   RPR Ser Ql NON REAC NON REAC  Hepatitis C antibody     Status: None   Collection Time: 05/29/15  9:11 AM  Result Value Ref Range   HCV Ab NEGATIVE NEGATIVE  Lipid panel     Status: Abnormal   Collection Time: 05/29/15  9:12 AM  Result Value Ref Range   Cholesterol 116 (L) 125 - 200 mg/dL   Triglycerides 57 <150 mg/dL   HDL 37 (L) >=40 mg/dL   Total CHOL/HDL Ratio 3.1 <=5.0 Ratio   VLDL 11 <30 mg/dL   LDL Cholesterol 68 <130 mg/dL    Comment:   Total Cholesterol/HDL Ratio:CHD Risk                        Coronary Heart Disease Risk Table                                        Men       Women          1/2 Average Risk              3.4        3.3              Average Risk              5.0        4.4           2X Average Risk              9.6        7.1           3X Average Risk             23.4       11.0 Use the calculated Patient Ratio above and the CHD Risk table  to determine the patient's CHD Risk.   COMPLETE METABOLIC PANEL WITH GFR     Status: Abnormal   Collection Time: 05/29/15  9:12 AM  Result  Value Ref Range   Sodium 138 135 - 146 mmol/L   Potassium 3.7 3.5 - 5.3 mmol/L   Chloride 105 98 - 110 mmol/L   CO2 19 (L) 20 - 31 mmol/L   Glucose, Bld 76 65 - 99 mg/dL   BUN 13 7 - 25 mg/dL   Creat 1.12 0.60 - 1.35 mg/dL    Total Bilirubin 1.5 (H) 0.2 - 1.2 mg/dL   Alkaline Phosphatase 71 40 - 115 U/L   AST 17 10 - 40 U/L   ALT 18 9 - 46 U/L   Total Protein 7.3 6.1 - 8.1 g/dL   Albumin 4.6 3.6 - 5.1 g/dL   Calcium 9.3 8.6 - 10.3 mg/dL   GFR, Est African American >89 >=60 mL/min   GFR, Est Non African American 86 >=60 mL/min    Comment:   The estimated GFR is a calculation valid for adults (>=12 years old) that uses the CKD-EPI algorithm to adjust for age and sex. It is   not to be used for children, pregnant women, hospitalized patients,    patients on dialysis, or with rapidly changing kidney function. According to the NKDEP, eGFR >89 is normal, 60-89 shows mild impairment, 30-59 shows moderate impairment, 15-29 shows severe impairment and <15 is ESRD.     CBC     Status: Abnormal   Collection Time: 05/29/15  9:12 AM  Result Value Ref Range   WBC 11.6 (H) 4.0 - 10.5 K/uL   RBC 5.70 4.22 - 5.81 MIL/uL   Hemoglobin 17.0 13.0 - 17.0 g/dL   HCT 48.7 39.0 - 52.0 %   MCV 85.4 78.0 - 100.0 fL   MCH 29.8 26.0 - 34.0 pg   MCHC 34.9 30.0 - 36.0 g/dL   RDW 13.3 11.5 - 15.5 %   Platelets 356 150 - 400 K/uL   MPV 9.9 8.6 - 12.4 fL    There were no vitals taken for this visit.    Substance Abuse : history of drinking beer 3 to 4 but denies recent use of alcohol or drugs.   Review of Systems  Constitutional: Negative for fever.  Cardiovascular: Negative for chest pain and palpitations.  Gastrointestinal: Negative for heartburn and nausea.  Skin: Negative for rash.  Neurological: Negative for tremors.  Psychiatric/Behavioral: Negative for depression, suicidal ideas and hallucinations.    Mental Status Examination  Appearance: casual Alert: Yes Attention: fair  Cooperative: Yes Eye Contact: Fair Speech: normal tone Psychomotor Activity: Decreased Memory/Concentration: adequate Oriented: person, place, time/date and situation Mood: euthymic Affect: Constricted Thought Processes and  Associations: Linear Fund of Knowledge: Fair Thought Content: Suicidal ideation and Homicidal ideation were denied Insight: Fair Judgement: Fair  Diagnosis:  generalized anxiety disorder. Adjustment disorder with depressed mood anxiety . ADD, inattentive type.insomnia  Treatment Plan:   Avoid or continue to abstain from alcohol. Says he has stopped  Adhd: continue adderall but lower dose to 68m xr. presctiption written  Insomnia; working on sleep hygiene.  Performance anxiety: stop klonopine will start ativan 0.572mprn  Pertinent Labs and Relevant Prior Notes reviewed. Medication Side effects, benefits and risks reviewed/discussed with Patient. Time given for patient to respond and asks questions regarding the Diagnosis and Medications. Safety concerns and to report to ER if suicidal or call 911. Relevant Medications refilled or called in to pharmacy. Discussed weight maintenance and Sleep Hygiene. Follow up with Primary care provider in regards to Medical conditions.  Greater than 50% of time was spend in counseling and  coordination of care with the patient.  Schedule for Follow up visit in  4 to 6 weeks  or call in earlier as necessary. Time spent: 25 minutes     Merian Capron, MD 08/14/2015

## 2015-08-14 NOTE — Telephone Encounter (Signed)
Return telephone call to pt.Pt express concerns about refills for Klonopin and Adderall. Pt states his job requires a lot of travel and he is unable to   Informed pt a f/u visit will need to schedule before refills for Klonopin and Adderall could be issued. Pt will need to keep appt. Pt is schedule for a f/u appt 08/14/15.

## 2015-09-14 ENCOUNTER — Telehealth (HOSPITAL_COMMUNITY): Payer: Self-pay | Admitting: *Deleted

## 2015-09-14 NOTE — Telephone Encounter (Signed)
Pt will need a prescription written for Adderall 10mg  and Ativan 0.5mg . Pt will pickup prescription on 09/15/15.

## 2015-09-15 MED ORDER — AMPHETAMINE-DEXTROAMPHET ER 10 MG PO CP24: ORAL_CAPSULE | ORAL | Status: DC

## 2015-09-15 MED ORDER — LORAZEPAM 0.5 MG PO TABS: 0.5000 mg | ORAL_TABLET | Freq: Every day | ORAL | Status: DC | PRN

## 2015-09-15 NOTE — Telephone Encounter (Signed)
adderall and ativan printed for pick up

## 2015-10-16 ENCOUNTER — Ambulatory Visit (HOSPITAL_COMMUNITY): Payer: Self-pay | Admitting: Psychiatry

## 2015-11-16 ENCOUNTER — Telehealth (HOSPITAL_COMMUNITY): Payer: Self-pay | Admitting: Psychiatry

## 2015-11-16 ENCOUNTER — Telehealth: Payer: Self-pay

## 2015-11-16 MED ORDER — AMPHETAMINE-DEXTROAMPHET ER 10 MG PO CP24: ORAL_CAPSULE | ORAL | 0 refills | Status: DC

## 2015-11-16 MED ORDER — LORAZEPAM 0.5 MG PO TABS: 0.5000 mg | ORAL_TABLET | Freq: Every day | ORAL | 0 refills | Status: DC | PRN

## 2015-11-16 NOTE — Telephone Encounter (Signed)
Yes I'm ok with this since it looks like nobody is covering for Dr. Mervyn SkeetersA this week.  Rx in [facility] in box.

## 2015-11-16 NOTE — Telephone Encounter (Signed)
Pt called to make an appointment for today. Dr. Bufford ButtnerAkhrtar is out of the office all week and will return on Tuesday 8/22. Offered pt an appointment for then and pt refused. Pt states he is out of meds and has been off his meds for a few weeks and needs them today. Explained to pt that we do not have a doctor in office this week we only have a therapist. Pt was upset that he could not get meds today. Pt has not been seen by us since May. Pt is going to contact Urgent care to get meds. Pt refused to make a future appointment at this time.

## 2015-11-16 NOTE — Telephone Encounter (Signed)
Pt.notified

## 2015-11-16 NOTE — Telephone Encounter (Signed)
Juan Mullins called and left a message stating he is in need of refills for Adderall and Ativan and wants to know if Dr Ivan AnchorsHommel with refill them. These medications are prescribed by Dr Gilmore LarocheAkhtar but he will be out of the office for 1 week. He had an appointment with Dr Gilmore LarocheAkhtar but it was cancelled. Please advise.

## 2015-11-26 ENCOUNTER — Encounter (HOSPITAL_COMMUNITY): Payer: Self-pay | Admitting: Psychiatry

## 2015-11-26 ENCOUNTER — Ambulatory Visit (INDEPENDENT_AMBULATORY_CARE_PROVIDER_SITE_OTHER): Payer: BLUE CROSS/BLUE SHIELD | Admitting: Psychiatry

## 2015-11-26 VITALS — BP 124/76 | HR 79 | Ht 74.0 in | Wt 228.4 lb

## 2015-11-26 DIAGNOSIS — F900 Attention-deficit hyperactivity disorder, predominantly inattentive type: Secondary | ICD-10-CM

## 2015-11-26 DIAGNOSIS — G4700 Insomnia, unspecified: Secondary | ICD-10-CM | POA: Diagnosis not present

## 2015-11-26 DIAGNOSIS — F418 Other specified anxiety disorders: Secondary | ICD-10-CM

## 2015-11-26 DIAGNOSIS — F419 Anxiety disorder, unspecified: Secondary | ICD-10-CM

## 2015-11-26 DIAGNOSIS — F411 Generalized anxiety disorder: Secondary | ICD-10-CM | POA: Diagnosis not present

## 2015-11-26 MED ORDER — AMPHETAMINE-DEXTROAMPHET ER 15 MG PO CP24: 15.0000 mg | ORAL_CAPSULE | Freq: Every day | ORAL | 0 refills | Status: DC

## 2015-11-26 MED ORDER — LORAZEPAM 0.5 MG PO TABS: 0.5000 mg | ORAL_TABLET | Freq: Every day | ORAL | 0 refills | Status: DC | PRN

## 2015-11-26 MED ORDER — SERTRALINE HCL 50 MG PO TABS: 50.0000 mg | ORAL_TABLET | Freq: Every day | ORAL | 0 refills | Status: DC

## 2015-11-26 MED ORDER — AMPHET-DEXTROAMPHET 3-BEAD ER 12.5 MG PO CP24: ORAL_CAPSULE | ORAL | 0 refills | Status: DC

## 2015-11-26 NOTE — Progress Notes (Addendum)
Patient ID: Juan Mullins, male   DOB: 02-06-1981, 35 y.o.   MR[facility] [id number] [facility]  Juan SilviusJustin Burback [id number] 35 y.o.  11/26/2015  Chief Complaint: Anxiety and ADD follow up   History of Present Illness:   Patient  Is a 35 years old currently single Caucasian male. Diagnosed with ADHD and  Generalized anxiety disorder.  Initially presented with " that they moved to [place] and he is now divorced he is now coming back and live here. He has been diagnosed with ADD with a primary care physician"  Patient is to get his tend to supply by Dr. Kary KosHummel upstairs when I was off. ADD; feels some inattention at 10mg  and want to go some higher again. Anxiety; generalized worries and also performance anxiety. Ativan helps some  But he wants to try SSRI now as well for anxiety Adjustment to dose and job; adderall helps keep toughts focused. Insomnia: baseline.  Medical complexity: somewhat concern about erectile function. Primary care gave him effexor but it made him sedate so he stopped it. Wants to consider SSRI . Has indulged in beers at time for increasing libido.  Working on activity level , goes to gym now and has stopped alcohol.  Aggravating factors separation of his wife.now divorced and that problem is settled down. .multi tasking and travel Modifying factors. Promotion to a managerial level.  Context. In attentiion, over stress if cannot perform at work. History of pre mature ejaculation as stress Severity of  depression 7/10. 10 being no depression.  Duration 3-4 years.  Severity of anxiety: 5/10. 10 being extreme.   Past Medical History:  Diagnosis Date  . Essential hypertension, benign 09/20/2013   Family History  Problem Relation Age of Onset  . Squamous cell carcinoma    . Squamous cell carcinoma Father     Outpatient Encounter Prescriptions as of 11/26/2015  Medication Sig  . amphetamine-dextroamphetamine 12.5 MG CP24 Take one a  day  . LORazepam (ATIVAN) 0.5 MG tablet Take 1 tablet (0.5 mg total) by mouth daily as needed for anxiety.  . sertraline (ZOLOFT) 50 MG tablet Take 1 tablet (50 mg total) by mouth daily.  . [DISCONTINUED] amphetamine-dextroamphetamine (ADDERALL XR) 10 MG 24 hr capsule Take one a day. Further management with Dr. Gilmore LarocheAkhtar.  . [DISCONTINUED] LORazepam (ATIVAN) 0.5 MG tablet Take 1 tablet (0.5 mg total) by mouth daily as needed for anxiety. Further management with Dr. Gilmore LarocheAkhtar.   No facility-administered encounter medications on file as of 11/26/2015.     No results found for this or any previous visit (from the past 2160 hour(s)).  BP 124/76 (BP Location: Right Arm, Patient Position: Sitting, Cuff Size: Normal)   Pulse 79   Ht 6\' 2"  (1.88 m)   Wt 228 lb 6.4 oz (103.6 kg)   SpO2 98%   BMI 29.32 kg/m     Substance Abuse : history of drinking beer 3 to 4 but denies recent use of alcohol or drugs.   Review of Systems  Constitutional: Negative for fever.  Cardiovascular: Negative for chest pain and palpitations.  Gastrointestinal: Negative for heartburn and nausea.  Skin: Negative for rash.  Neurological: Negative for tremors and headaches.  Psychiatric/Behavioral: Negative for depression, hallucinations and suicidal ideas.    Mental Status Examination  Appearance: casual Alert: Yes Attention: fair  Cooperative: Yes Eye Contact: Fair Speech: normal tone Psychomotor Activity: Decreased Memory/Concentration: adequate Oriented: person, place, time/date and situation Mood: euthymic Affect: Constricted Thought Processes and  Associations: Linear Fund of Knowledge: Fair Thought Content: Suicidal ideation and Homicidal ideation were denied Insight: Fair Judgement: Fair  Diagnosis:  generalized anxiety disorder. Adjustment disorder with depressed mood anxiety . ADD, inattentive type.insomnia  Treatment Plan:   Avoid or continue to abstain from alcohol. Says he has stopped  Adhd:  increase adderall to 12.5mg   12.5mg  not available at pharmacy. Will change to 15mg    Insomnia; working on sleep hygiene.  Performance anxiety: ativan prn and will start zoloft 50mg  qd also for GAD  Pertinent Labs and Relevant Prior Notes reviewed. Medication Side effects, benefits and risks reviewed/discussed with Patient. Time given for patient to respond and asks questions regarding the Diagnosis and Medications. Safety concerns and to report to ER if suicidal or call 911. Relevant Medications refilled or called in to pharmacy. Discussed weight maintenance and Sleep Hygiene. Follow up with Primary care provider in regards to Medical conditions.  Greater than 50% of time was spend in counseling and coordination of care with the patient.  Schedule for Follow up visit in  4 to 6 weeks  or call in earlier as necessary. Time spent: 25 minutes     Thresa RossAKHTAR, Ryelle Ruvalcaba, MD 11/26/2015

## 2015-11-26 NOTE — Addendum Note (Signed)
Addended by: Thresa RossAKHTAR, Tonita Bills on: 11/26/2015 11:26 AM   Modules accepted: Orders

## 2015-11-27 NOTE — Telephone Encounter (Signed)
Patient seen in office and meds provided.

## 2015-12-25 ENCOUNTER — Ambulatory Visit (INDEPENDENT_AMBULATORY_CARE_PROVIDER_SITE_OTHER): Payer: BLUE CROSS/BLUE SHIELD | Admitting: Psychiatry

## 2015-12-25 ENCOUNTER — Encounter (HOSPITAL_COMMUNITY): Payer: Self-pay | Admitting: Psychiatry

## 2015-12-25 VITALS — BP 124/70 | HR 82 | Resp 14 | Ht 74.0 in | Wt 226.0 lb

## 2015-12-25 DIAGNOSIS — F418 Other specified anxiety disorders: Secondary | ICD-10-CM

## 2015-12-25 DIAGNOSIS — F900 Attention-deficit hyperactivity disorder, predominantly inattentive type: Secondary | ICD-10-CM | POA: Diagnosis not present

## 2015-12-25 DIAGNOSIS — F419 Anxiety disorder, unspecified: Secondary | ICD-10-CM | POA: Diagnosis not present

## 2015-12-25 DIAGNOSIS — F411 Generalized anxiety disorder: Secondary | ICD-10-CM

## 2015-12-25 DIAGNOSIS — G4700 Insomnia, unspecified: Secondary | ICD-10-CM | POA: Diagnosis not present

## 2015-12-25 MED ORDER — AMPHETAMINE-DEXTROAMPHET ER 15 MG PO CP24: 15.0000 mg | ORAL_CAPSULE | Freq: Every day | ORAL | 0 refills | Status: DC

## 2015-12-25 NOTE — Progress Notes (Signed)
Patient ID: Juan Mullins, male   DOB: 1980-09-05, 35 y.o.   MRN: [id number] [facility]  Juan Mullins 35 y.o.  12/25/2015  Chief Complaint: Anxiety and ADD follow up   History of Present Illness:   Patient  Is a 35 years old currently single Caucasian male. Diagnosed with ADHD and  Generalized anxiety disorder.  Initially presented with " that they moved to [place] and he is now divorced he is now coming back and live here. He has been diagnosed with ADD with a primary care physician"   ADD; feels better on adderall 15mg  xr. Wants brand name Anxiety; generalized worries and also performance anxiety. Ativan helps some . zoloft made him sedate  Adjustment to dose and job; adderall helps keep toughts focused. Insomnia: baseline.  Medical complexity: somewhat concern about erectile function. Primary care gave him effexor but it made him sedate so he stopped it. Stopped zoloft as he was feeling tired.  Has indulged in beers at time for increasing libido.  Working on activity level , goes to gym now and has stopped alcohol.  Aggravating factors separation of his wife.now divorced and that problem is settled down. .multi tasking and travel Modifying factors. Promotion to a managerial level.  Context. In attentiion, over stress if cannot perform at work. History of pre mature ejaculation as stress Severity of  depression 7/10. 10 being no depression.  Duration 3-4 years.  Severity of anxiety: 5/10. 10 being extreme.   Past Medical History:  Diagnosis Date  . Essential hypertension, benign 09/20/2013   Family History  Problem Relation Age of Onset  . Squamous cell carcinoma    . Squamous cell carcinoma Father     Outpatient Encounter Prescriptions as of 12/25/2015  Medication Sig  . amphetamine-dextroamphetamine (ADDERALL XR) 15 MG 24 hr capsule Take 1 capsule by mouth daily.  Marland Kitchen. LORazepam (ATIVAN) 0.5 MG tablet Take 1 tablet (0.5  mg total) by mouth daily as needed for anxiety.  . sertraline (ZOLOFT) 50 MG tablet Take 1 tablet (50 mg total) by mouth daily.  . [DISCONTINUED] amphetamine-dextroamphetamine (ADDERALL XR) 15 MG 24 hr capsule Take 1 capsule by mouth daily.   No facility-administered encounter medications on file as of 12/25/2015.     No results found for this or any previous visit (from the past 2160 hour(s)).  BP 124/70 (BP Location: Right Arm, Patient Position: Sitting, Cuff Size: Normal)   Pulse 82   Resp 14   Ht 6\' 2"  (1.88 m)   Wt 226 lb (102.5 kg)   SpO2 97%   BMI 29.02 kg/m     Substance Abuse : history of drinking beer 3 to 4 but denies recent use of alcohol or drugs.   Review of Systems  Constitutional: Negative for fever.  Cardiovascular: Negative for chest pain.  Gastrointestinal: Negative for heartburn and nausea.  Skin: Negative for rash.  Neurological: Negative for tremors and headaches.  Psychiatric/Behavioral: Negative for depression and hallucinations.    Mental Status Examination  Appearance: casual Alert: Yes Attention: fair  Cooperative: Yes Eye Contact: Fair Speech: normal tone Psychomotor Activity: Decreased Memory/Concentration: adequate Oriented: person, place, time/date and situation Mood: euthymic Affect: Constricted Thought Processes and Associations: Linear Fund of Knowledge: Fair Thought Content: Suicidal ideation and Homicidal ideation were denied Insight: Fair Judgement: Fair  Diagnosis:  generalized anxiety disorder. Adjustment disorder with depressed mood anxiety . ADD, inattentive type.insomnia  Treatment Plan:   Avoid or continue to abstain from  alcohol. Says he has stopped  Adhd: continue  adderall15mg  . Prescription written   erectily dysfunction: take zoloft at night as it caused tiredness during the day and avoid with ativan. Keep ativan for performance anxiety only.  Insomnia; working on sleep hygiene.  Performance anxiety: ativan prn    Pertinent Labs and Relevant Prior Notes reviewed. Medication Side effects, benefits and risks reviewed/discussed with Patient. Time given for patient to respond and asks questions regarding the Diagnosis and Medications. Safety concerns and to report to ER if suicidal or call 911. Relevant Medications refilled or called in to pharmacy. Discussed weight maintenance and Sleep Hygiene. Follow up with Primary care provider in regards to Medical conditions.  Greater than 50% of time was spend in counseling and coordination of care with the patient.  Schedule for Follow up visit in  4 to 6 weeks  or call in earlier as necessary. Time spent: 25 minutes     Thresa Ross, MD 12/25/2015

## 2016-01-18 ENCOUNTER — Telehealth (HOSPITAL_COMMUNITY): Payer: Self-pay | Admitting: Psychiatry

## 2016-01-18 NOTE — Telephone Encounter (Signed)
Pt would like a rx for adderall and ativan.  Pt will pick on Friday.  [phone]

## 2016-01-19 MED ORDER — AMPHETAMINE-DEXTROAMPHET ER 15 MG PO CP24: 15.0000 mg | ORAL_CAPSULE | Freq: Every day | ORAL | 0 refills | Status: DC

## 2016-01-19 MED ORDER — LORAZEPAM 0.5 MG PO TABS: 0.5000 mg | ORAL_TABLET | Freq: Every day | ORAL | 0 refills | Status: DC | PRN

## 2016-01-19 NOTE — Telephone Encounter (Signed)
Medication refill- pt phone office requesting a refill for Adderall and Ativan. Per Dr. Gilmore LarocheAkhtar, refills are authorized for Adderall 15mg , #30 and Ativan 0.5mg , #15. Prescriptions are printed for pickup. lvm for pt to contact office. Pt has a f/u appt on 11/17.

## 2016-02-19 ENCOUNTER — Ambulatory Visit (HOSPITAL_COMMUNITY): Payer: Self-pay | Admitting: Psychiatry

## 2016-06-09 ENCOUNTER — Ambulatory Visit (INDEPENDENT_AMBULATORY_CARE_PROVIDER_SITE_OTHER): Payer: Managed Care, Other (non HMO) | Admitting: Osteopathic Medicine

## 2016-06-09 ENCOUNTER — Encounter: Payer: Self-pay | Admitting: Osteopathic Medicine

## 2016-06-09 ENCOUNTER — Ambulatory Visit (INDEPENDENT_AMBULATORY_CARE_PROVIDER_SITE_OTHER): Payer: Managed Care, Other (non HMO)

## 2016-06-09 VITALS — BP 125/75 | HR 72 | Ht 74.0 in | Wt 220.0 lb

## 2016-06-09 DIAGNOSIS — Z0000 Encounter for general adult medical examination without abnormal findings: Secondary | ICD-10-CM | POA: Diagnosis not present

## 2016-06-09 DIAGNOSIS — M79661 Pain in right lower leg: Secondary | ICD-10-CM

## 2016-06-09 DIAGNOSIS — M79604 Pain in right leg: Secondary | ICD-10-CM | POA: Insufficient documentation

## 2016-06-09 DIAGNOSIS — Z113 Encounter for screening for infections with a predominantly sexual mode of transmission: Secondary | ICD-10-CM | POA: Diagnosis not present

## 2016-06-09 DIAGNOSIS — D72829 Elevated white blood cell count, unspecified: Secondary | ICD-10-CM | POA: Diagnosis not present

## 2016-06-09 LAB — CBC WITH DIFFERENTIAL/PLATELET
Basophils Absolute: 0 cells/uL (ref 0–200)
Basophils Relative: 0 %
EOS PCT: 3 %
Eosinophils Absolute: 378 cells/uL (ref 15–500)
HEMATOCRIT: 44.1 % (ref 38.5–50.0)
HEMOGLOBIN: 14.7 g/dL (ref 13.2–17.1)
LYMPHS ABS: 2268 {cells}/uL (ref 850–3900)
Lymphocytes Relative: 18 %
MCH: 29.5 pg (ref 27.0–33.0)
MCHC: 33.3 g/dL (ref 32.0–36.0)
MCV: 88.4 fL (ref 80.0–100.0)
MONO ABS: 1008 {cells}/uL — AB (ref 200–950)
MPV: 9.7 fL (ref 7.5–12.5)
Monocytes Relative: 8 %
NEUTROS ABS: 8946 {cells}/uL — AB (ref 1500–7800)
NEUTROS PCT: 71 %
Platelets: 362 10*3/uL (ref 140–400)
RBC: 4.99 MIL/uL (ref 4.20–5.80)
RDW: 13.9 % (ref 11.0–15.0)
WBC: 12.6 10*3/uL — AB (ref 3.8–10.8)

## 2016-06-09 LAB — TSH: TSH: 1.07 mIU/L (ref 0.40–4.50)

## 2016-06-09 NOTE — Progress Notes (Addendum)
HPI: Juan Mullins is a 36 y.o. male  who presents to [facility] today, 06/10/16,  for chief complaint of:  Chief Complaint  Patient presents with  . Establish Care    switch from [place]/Annual    Annual physical today, see below for review of preventive care.  Right leg pain: Medial shin, ongoing about 2 weeks or so, happened after running on concrete, has not been able to run comfortably since so has not been doing so. Worse with walking, better with rest.   Generalized anxiety disorder and history of ADHD: Previously following with psychiatry, patient has weaned himself off medications and is doing well   Past medical, surgical, social and family history reviewed: Patient Active Problem List   Diagnosis Date Noted  . Leukocytosis 06/10/2016  . Right leg pain 06/09/2016  . Essential hypertension, benign 09/20/2013  . Premature ejaculation 09/20/2013  . Obesity 09/20/2013  . Generalized anxiety disorder 09/17/2013   No past surgical history on file. Social History  Substance Use Topics  . Smoking status: Never Smoker  . Smokeless tobacco: Never Used  . Alcohol use 13.2 oz/week    2 Cans of beer, 20 Standard drinks or equivalent per week   Family History  Problem Relation Age of Onset  . Squamous cell carcinoma    . Squamous cell carcinoma Father      Current medication list and allergy/intolerance information reviewed:   No current outpatient prescriptions on file.   No current facility-administered medications for this visit.    Allergies  Allergen Reactions  . Fexofenadine Itching    hiccups      Review of Systems:  Constitutional:  No  fever, no chills, No recent illness, No unintentional weight changes. No significant fatigue.   HEENT: No  headache, no vision change, no hearing change, No sore throat, No  sinus pressure  Cardiac: No  chest pain, No  pressure, No palpitations, No  Orthopnea  Respiratory:  No   shortness of breath. No  Cough  Gastrointestinal: No  abdominal pain, No  nausea, No  vomiting,  No  blood in stool, No  diarrhea, No  constipation   Musculoskeletal: +new myalgia/arthralgia  Skin: No  Rash  Hem/Onc: No  easy bruising/bleeding, No  abnormal lymph node  Endocrine: No cold intolerance,  No heat intolerance. No polyuria/polydipsia/polyphagia   Neurologic: No  weakness, No  dizziness,  Psychiatric: No  concerns with depression, No  concerns with anxiety  Exam:  BP 125/75   Pulse 72   Ht 6\' 2"  (1.88 m)   Wt 220 lb (99.8 kg)   BMI 28.25 kg/m   Constitutional: VS see above. General Appearance: alert, well-developed, well-nourished, NAD  Eyes: Normal lids and conjunctive, non-icteric sclera  Ears, Nose, Mouth, Throat: MMM, Normal external inspection ears/nares/mouth/lips/gums. TM normal bilaterally. Pharynx/tonsils no erythema, no exudate. Nasal mucosa normal.   Neck: No masses, trachea midline. No thyroid enlargement. No tenderness/mass appreciated. No lymphadenopathy  Respiratory: Normal respiratory effort. no wheeze, no rhonchi, no rales  Cardiovascular: S1/S2 normal, no murmur, no rub/gallop auscultated. RRR. No lower extremity edema. P  Gastrointestinal: Nontender, no masses. No hepatomegaly, no splenomegaly. No hernia appreciated. Bowel sounds normal. Rectal exam deferred.   Musculoskeletal: Gait normal. No clubbing/cyanosis of digits.  Right lower extremity tender to palpation medial to mid tibia, normal eversion/inversion of ankle against resistance, normal dorsiflexion/plantar flexion against resistance, unable to bear weight/jump more than a few times without pain   Neurological: Normal  balance/coordination. No tremor. No cranial nerve deficit on limited exam. Motor and sensation intact and symmetric. Cerebellar reflexes intact.   Skin: warm, dry, intact. No rash/ulcer. No concerning nevi or subq nodules on limited exam.    Psychiatric: Normal  judgment/insight. Normal mood and affect. Oriented x3.      ASSESSMENT/PLAN:   Annual physical exam - Plan: CBC with Differential/Platelet, COMPLETE METABOLIC PANEL WITH GFR, Lipid panel, TSH, VITAMIN D 25 Hydroxy (Vit-D Deficiency, Fractures)  Right leg pain - Possible stress fracture, will plan for rest for 6 weeks total, air cast, defer MRI for now. Appreciate assistance of Dr. Denyse Amass, pt to followup with him  - Plan: DG Tibia/Fibula Right  Routine screening for STI (sexually transmitted infection) - Plan: HIV antibody, RPR, Hepatitis B core antibody, total, Hepatitis B surface antibody, Hepatitis B surface antigen, Hepatitis C antibody, reflex, GC/chlamydia probe amp, urine  Leukocytosis, unspecified type - We'll recheck CBC in 1 week - Plan: Pathologist smear review, CBC with Differential/Platelet   MALE PREVENTIVE CARE  updated 06/10/16  ANNUAL SCREENING/COUNSELING  Any changes to health in the past year? Working out more, no diet changes cutting out gluten   Diet/Exercise - HEALTHY HABITS DISCUSSED TO DECREASE CV RISK History  Smoking Status  . Never Smoker  Smokeless Tobacco  . Never Used   History  Alcohol Use  . 13.2 oz/week  . 2 Cans of beer, 20 Standard drinks or equivalent per week   Depression screen [facility] 2/9 06/09/2016  Decreased Interest 0  Down, Depressed, Hopeless 0  PHQ - 2 Score 0  Altered sleeping 0  Tired, decreased energy 0  Change in appetite 0  Feeling bad or failure about yourself  0  Trouble concentrating 0  Moving slowly or fidgety/restless 0  Suicidal thoughts 0  PHQ-9 Score 0    SEXUAL/REPRODUCTIVE HEALTH  Sexually active in the past year? - No not currently   STI testing needed/desired today? - no  Any concerns with testosterone/libido? - no  INFECTIOUS DISEASE SCREENING  HIV - does not need  GC/CT - does not need  HepC - does not need  TB - does not need  CANCER SCREENING  Lung - does not need  Colon - does not  need  Prostate - does not need  OTHER DISEASE SCREENING  Lipid - does not need  DM2 - needs  AAA - 65-75yo ever smoked: does not need  Osteoporosis - men 34yo+ - does not need  ADULT VACCINATION  Influenza - annual vaccine recommended  Td - booster every 10 years   Zoster - option at 108, yes at 60+   PCV13 - was not indicated  PPSV23 - was not indicated Immunization History  Administered Date(s) Administered  . DTaP 04/04/2010  . Influenza-Unspecified 12/18/2015  . Tdap 05/29/2015         Visit summary with medication list and pertinent instructions was printed for patient to review. All questions at time of visit were answered - patient instructed to contact office with any additional concerns. ER/RTC precautions were reviewed with the patient. Follow-up plan: Return for ORTHO followup with Dr Denyse Amass 2-3 weeks, and in one year for annual .

## 2016-06-10 DIAGNOSIS — D72829 Elevated white blood cell count, unspecified: Secondary | ICD-10-CM | POA: Insufficient documentation

## 2016-06-10 LAB — COMPLETE METABOLIC PANEL WITH GFR
ALBUMIN: 4.4 g/dL (ref 3.6–5.1)
ALK PHOS: 47 U/L (ref 40–115)
ALT: 28 U/L (ref 9–46)
AST: 29 U/L (ref 10–40)
BUN: 15 mg/dL (ref 7–25)
CALCIUM: 9.2 mg/dL (ref 8.6–10.3)
CHLORIDE: 102 mmol/L (ref 98–110)
CO2: 29 mmol/L (ref 20–31)
Creat: 1.26 mg/dL (ref 0.60–1.35)
GFR, Est African American: 86 mL/min (ref >=60)
GFR, Est Non African American: 74 mL/min (ref >=60)
Glucose, Bld: 98 mg/dL (ref 65–99)
POTASSIUM: 4 mmol/L (ref 3.5–5.3)
Sodium: 142 mmol/L (ref 135–146)
Total Bilirubin: 0.6 mg/dL (ref 0.2–1.2)
Total Protein: 6.7 g/dL (ref 6.1–8.1)

## 2016-06-10 LAB — CBC WITH DIFFERENTIAL/PLATELET
BASOS PCT: 1 %
Basophils Absolute: 98 cells/uL (ref 0–200)
Eosinophils Absolute: 294 cells/uL (ref 15–500)
Eosinophils Relative: 3 %
HCT: 45.3 % (ref 38.5–50.0)
Hemoglobin: 15.1 g/dL (ref 13.2–17.1)
Lymphocytes Relative: 27 %
Lymphs Abs: 2646 cells/uL (ref 850–3900)
MCH: 29.4 pg (ref 27.0–33.0)
MCHC: 33.3 g/dL (ref 32.0–36.0)
MCV: 88.3 fL (ref 80.0–100.0)
MONOS PCT: 10 %
MPV: 9.8 fL (ref 7.5–12.5)
Monocytes Absolute: 980 cells/uL — ABNORMAL HIGH (ref 200–950)
Neutro Abs: 5782 cells/uL (ref 1500–7800)
Neutrophils Relative %: 59 %
PLATELETS: 373 10*3/uL (ref 140–400)
RBC: 5.13 MIL/uL (ref 4.20–5.80)
RDW: 14 % (ref 11.0–15.0)
WBC: 9.8 10*3/uL (ref 3.8–10.8)

## 2016-06-10 LAB — LIPID PANEL
CHOL/HDL RATIO: 3.4 ratio (ref <5.0)
CHOLESTEROL: 103 mg/dL (ref <200)
HDL: 30 mg/dL — ABNORMAL LOW (ref >40)
LDL Cholesterol: 61 mg/dL (ref <100)
TRIGLYCERIDES: 58 mg/dL (ref <150)
VLDL: 12 mg/dL (ref <30)

## 2016-06-10 LAB — VITAMIN D 25 HYDROXY (VIT D DEFICIENCY, FRACTURES): VIT D 25 HYDROXY: 35 ng/mL (ref 30–100)

## 2016-06-10 NOTE — Addendum Note (Signed)
Addended by: Deirdre PippinsALEXANDER, Atha Mcbain M on: 06/10/2016 12:43 PM   Modules accepted: Orders

## 2016-06-10 NOTE — Addendum Note (Signed)
Addended by: Pixie CasinoUNNINGHAM, RHONDA C on: 06/10/2016 03:21 PM   Modules accepted: Orders

## 2016-06-11 LAB — HEPATITIS C ANTIBODY: HCV AB: NEGATIVE

## 2016-06-11 LAB — HEPATITIS B CORE ANTIBODY, TOTAL: Hep B Core Total Ab: NONREACTIVE

## 2016-06-11 LAB — HEPATITIS B SURFACE ANTIGEN: Hepatitis B Surface Ag: NEGATIVE

## 2016-06-11 LAB — HIV ANTIBODY (ROUTINE TESTING W REFLEX): HIV: NONREACTIVE

## 2016-06-11 LAB — RPR

## 2016-06-11 LAB — HEPATITIS B SURFACE ANTIBODY,QUALITATIVE: Hep B S Ab: POSITIVE — AB

## 2016-06-13 ENCOUNTER — Encounter: Payer: Self-pay | Admitting: Osteopathic Medicine

## 2016-06-13 LAB — PATHOLOGIST SMEAR REVIEW

## 2016-06-13 LAB — GC/CHLAMYDIA PROBE AMP
CT PROBE, AMP APTIMA: NOT DETECTED
GC Probe RNA: NOT DETECTED

## 2016-06-14 LAB — HEPATITIS C AB W/RFL RNA, PCR + GENO
Hepatitis C Ab: NONREACTIVE
Signal to Cutoff: 0.02 ratio (ref <1.00)

## 2016-11-23 ENCOUNTER — Ambulatory Visit (INDEPENDENT_AMBULATORY_CARE_PROVIDER_SITE_OTHER): Payer: 59 | Admitting: Osteopathic Medicine

## 2016-11-23 ENCOUNTER — Telehealth: Payer: Self-pay | Admitting: Osteopathic Medicine

## 2016-11-23 ENCOUNTER — Other Ambulatory Visit: Payer: Self-pay | Admitting: Osteopathic Medicine

## 2016-11-23 ENCOUNTER — Encounter: Payer: Self-pay | Admitting: Osteopathic Medicine

## 2016-11-23 VITALS — BP 128/64 | HR 95 | Temp 98.2°F | Resp 16 | Wt 223.0 lb

## 2016-11-23 DIAGNOSIS — Z113 Encounter for screening for infections with a predominantly sexual mode of transmission: Secondary | ICD-10-CM

## 2016-11-23 DIAGNOSIS — Z23 Encounter for immunization: Secondary | ICD-10-CM

## 2016-11-23 DIAGNOSIS — N529 Male erectile dysfunction, unspecified: Secondary | ICD-10-CM | POA: Diagnosis not present

## 2016-11-23 LAB — TSH: TSH: 2.22 m[IU]/L (ref 0.40–4.50)

## 2016-11-23 LAB — GC/CHLAMYDIA PROBE AMP, URINE

## 2016-11-23 MED ORDER — SILDENAFIL CITRATE 20 MG PO TABS: 20.0000 mg | ORAL_TABLET | Freq: Every day | ORAL | 5 refills | Status: DC | PRN

## 2016-11-23 NOTE — Telephone Encounter (Signed)
Pt called.  Script for generic for  Viagra has not yet been called in - he is at [facility] now.  Thank you

## 2016-11-23 NOTE — Progress Notes (Signed)
HPI: Juan Mullins is a 36 y.o. male  who presents to [facility] [place] today, 11/23/16,  for chief complaint of:  Chief Complaint  Patient presents with  . Other    erectile issues, STI routine screen    Would like to discuss Cialis or Viagra - a friend gave him some to try and he noticed a big difference in his sexual performance. He'd rather get a prescription than order online. No loss libido, no significant difficulty achieving erection but maintaining is not what it used to be. No fatigue or depression.   Requests STD testing - gets this done routinely few times per year. No known exposure.   Fasting for labs if needed     Past medical history, surgical history, social history and family history reviewed.  Patient Active Problem List   Diagnosis Date Noted  . Leukocytosis 06/10/2016  . Right leg pain 06/09/2016  . Essential hypertension, benign 09/20/2013  . Premature ejaculation 09/20/2013  . Obesity 09/20/2013  . Generalized anxiety disorder 09/17/2013    Current medication list and allergy/intolerance information reviewed.   Current Outpatient Prescriptions on File Prior to Visit  Medication Sig Dispense Refill  . [DISCONTINUED] atomoxetine (STRATTERA) 25 MG capsule Take 1 capsule (25 mg total) by mouth daily. 30 capsule 0  . [DISCONTINUED] buPROPion (WELLBUTRIN SR) 150 MG 12 hr tablet Take 1 tablet (150 mg total) by mouth daily. (Patient not taking: Reported on 10/31/2014) 30 tablet 1  . [DISCONTINUED] clonazePAM (KLONOPIN) 0.5 MG tablet Take 1 tablet (0.5 mg total) by mouth as needed for anxiety. 10 tablet 0  . [DISCONTINUED] FLUoxetine (PROZAC) 20 MG tablet Take 1 tablet (20 mg total) by mouth daily. 60 tablet 0  . [DISCONTINUED] Phentermine HCl 30 MG TBDP Half to full tablet every morning for appetite suppression. Nurse visit for weight check needed for refills. 30 tablet 0  . [DISCONTINUED] propranolol (INDERAL) 10 MG tablet Take 1 tablet  (10 mg total) by mouth daily as needed. 30 tablet 0   No current facility-administered medications on file prior to visit.    Allergies  Allergen Reactions  . Fexofenadine Itching    hiccups      Review of Systems:  Constitutional: No recent illness  Cardiac: No  chest pain, No  pressure  Respiratory:  No  shortness of breath. No  Cough  Gastrointestinal: No  abdominal pain  Musculoskeletal: No new myalgia/arthralgia  Skin: No  Rash  Psychiatric: No  concerns with depression, No  concerns with anxiety  Exam:  BP 128/64   Pulse 95   Temp 98.2 F (36.8 C) (Oral)   Resp 16   Wt 223 lb (101.2 kg)   SpO2 100%   BMI 28.63 kg/m   Constitutional: VS see above. General Appearance: alert, well-developed, well-nourished, NAD  Eyes: Normal lids and conjunctive, non-icteric sclera  Ears, Nose, Mouth, Throat: MMM, Normal external inspection ears/nares/mouth/lips/gums.  Neck: No masses, trachea midline.   Respiratory: Normal respiratory effort. no wheeze, no rhonchi, no rales  Cardiovascular: S1/S2 normal, no murmur, no rub/gallop auscultated. RRR.   Musculoskeletal: Gait normal. Symmetric and independent movement of all extremities  Neurological: Normal balance/coordination. No tremor.  Skin: warm, dry, intact.   Psychiatric: Normal judgment/insight. Normal mood and affect. Oriented x3.     Depression screen [facility] 2/9 11/23/2016 06/09/2016  Decreased Interest 0 0  Down, Depressed, Hopeless 0 0  PHQ - 2 Score 0 0  Altered sleeping - 0  Tired, decreased  energy - 0  Change in appetite - 0  Feeling bad or failure about yourself  - 0  Trouble concentrating - 0  Moving slowly or fidgety/restless - 0  Suicidal thoughts - 0  PHQ-9 Score - 0      ASSESSMENT/PLAN: No secondary symptoms concerning for testosterone deficiency, I agree that the prescriptions online is unwise, I'm ok to prescribe as long as labs are good   Erectile dysfunction, unspecified erectile  dysfunction type - Plan: COMPLETE METABOLIC PANEL WITH GFR, TSH, sildenafil (REVATIO) 20 MG tablet  Screen for STD (sexually transmitted disease) - Plan: HIV antibody, RPR, Hepatitis B surface antigen, Hepatitis B core antibody, total, Hepatitis C antibody, GC/chlamydia probe amp, urine, GC Probe amplification, urine  Needs flu shot - Plan: Flu Vaccine QUAD 6+ mos PF IM (Fluarix Quad PF)    Follow-up plan: Return for annual physical when due.  Visit summary with medication list and pertinent instructions was printed for patient to review, alert [place] if any changes needed. All questions at time of visit were answered - patient instructed to contact office with any additional concerns. ER/RTC precautions were reviewed with the patient and understanding verbalized.   Note: Total time spent 25 minutes, greater than 50% of the visit was spent face-to-face counseling and coordinating care for the following: The primary encounter diagnosis was Erectile dysfunction, unspecified erectile dysfunction type. Diagnoses of Screen for STD (sexually transmitted disease) and Needs flu shot were also pertinent to this visit.Marland Kitchen

## 2016-11-23 NOTE — Telephone Encounter (Signed)
Medication sent.  Patient aware  

## 2016-11-24 LAB — COMPLETE METABOLIC PANEL WITH GFR
ALBUMIN: 4.3 g/dL (ref 3.6–5.1)
ALT: 17 U/L (ref 9–46)
AST: 18 U/L (ref 10–40)
Alkaline Phosphatase: 54 U/L (ref 40–115)
BUN: 15 mg/dL (ref 7–25)
CALCIUM: 9.3 mg/dL (ref 8.6–10.3)
CO2: 21 mmol/L (ref 20–32)
Chloride: 99 mmol/L (ref 98–110)
Creat: 1.09 mg/dL (ref 0.60–1.35)
GFR, EST NON AFRICAN AMERICAN: 88 mL/min (ref >=60)
Glucose, Bld: 84 mg/dL (ref 65–99)
Potassium: 4.4 mmol/L (ref 3.5–5.3)
Sodium: 138 mmol/L (ref 135–146)
TOTAL PROTEIN: 6.9 g/dL (ref 6.1–8.1)
Total Bilirubin: 1.2 mg/dL (ref 0.2–1.2)

## 2016-11-24 LAB — HEPATITIS B CORE ANTIBODY, TOTAL: HEP B C TOTAL AB: NONREACTIVE

## 2016-11-24 LAB — HEPATITIS C ANTIBODY: HCV Ab: NONREACTIVE

## 2016-11-24 LAB — HEPATITIS B SURFACE ANTIGEN: HEP B S AG: NONREACTIVE

## 2016-11-24 LAB — RPR

## 2016-11-24 LAB — HIV ANTIBODY (ROUTINE TESTING W REFLEX): HIV 1&2 Ab, 4th Generation: NONREACTIVE

## 2016-11-25 ENCOUNTER — Telehealth: Payer: Self-pay

## 2016-11-25 LAB — NEISSERIA GONORRHOEAE, PROBE AMP: GC Probe RNA: NOT DETECTED

## 2016-11-25 LAB — CHLAMYDIA TRACHOMATIS, PROBE AMP: CT PROBE, AMP APTIMA: NOT DETECTED

## 2016-11-25 NOTE — Telephone Encounter (Signed)
Pre Authorization was sent to [employer] for sildenafil. [id number] - Rx #: [id number]

## 2016-11-28 ENCOUNTER — Encounter: Payer: Self-pay | Admitting: Osteopathic Medicine

## 2016-11-28 MED ORDER — TADALAFIL 10 MG PO TABS: 5.0000 mg | ORAL_TABLET | Freq: Every day | ORAL | 5 refills | Status: AC | PRN

## 2017-09-05 IMAGING — DX DG TIBIA/FIBULA 2V*R*
4 series · 4 of 4 positions shown · non-contrast
Comparison: None.

CLINICAL DATA: Right medial tibial and fibular pain for few weeks

EXAM:
RIGHT TIBIA AND FIBULA - 2 VIEW

[tibia ap (1 of 2)]
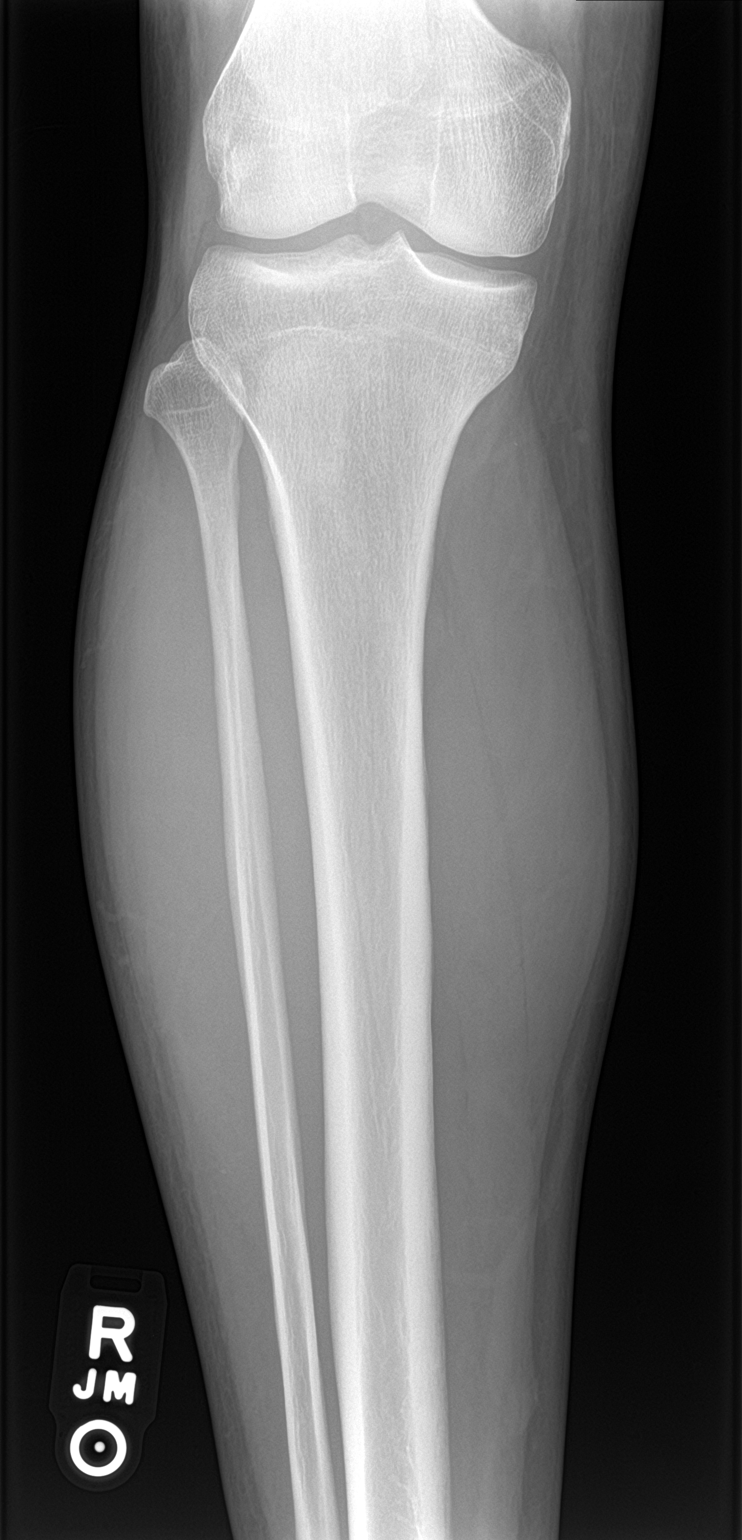

[tibia ap (2 of 2)]
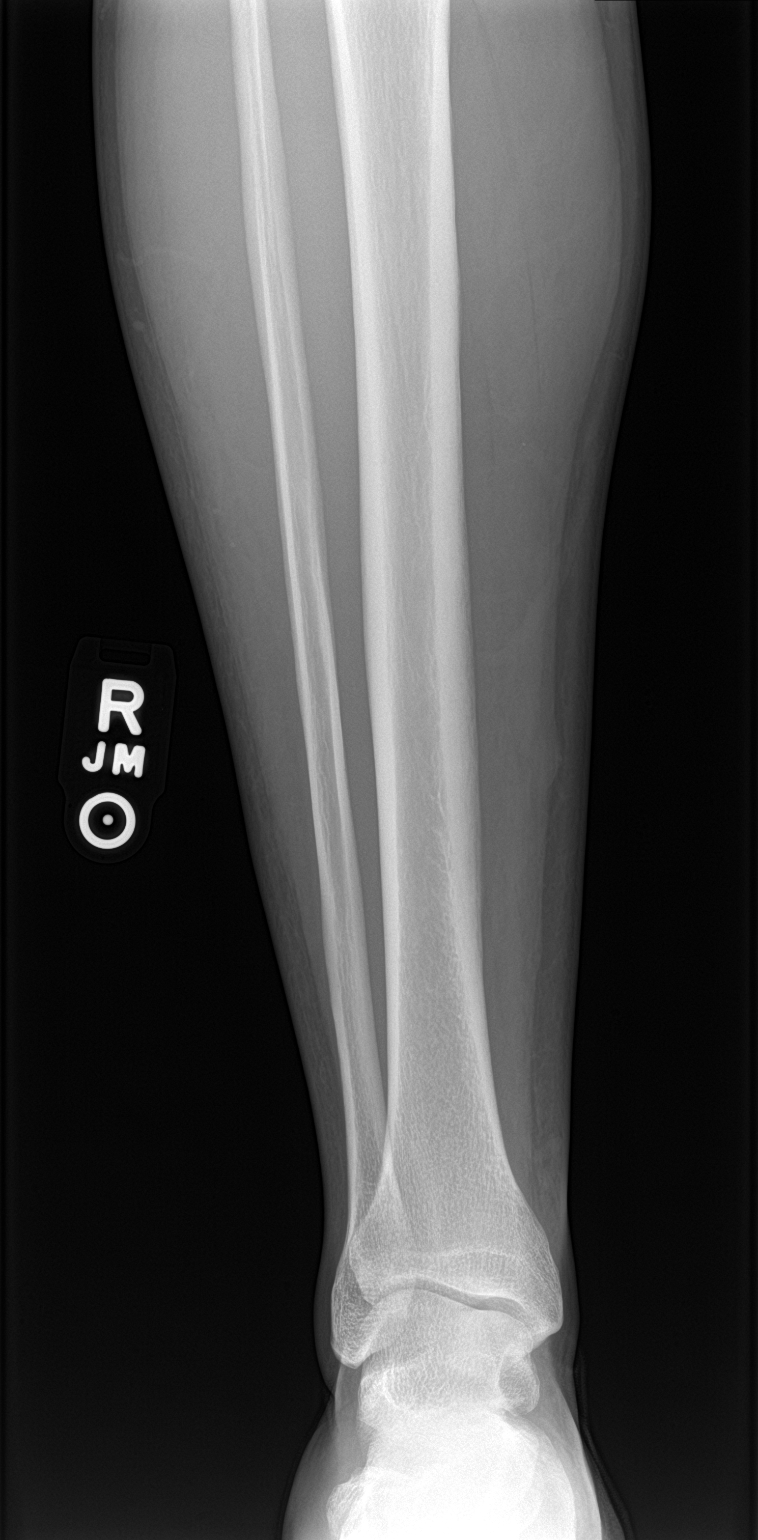

[tibia lat (1 of 2)]
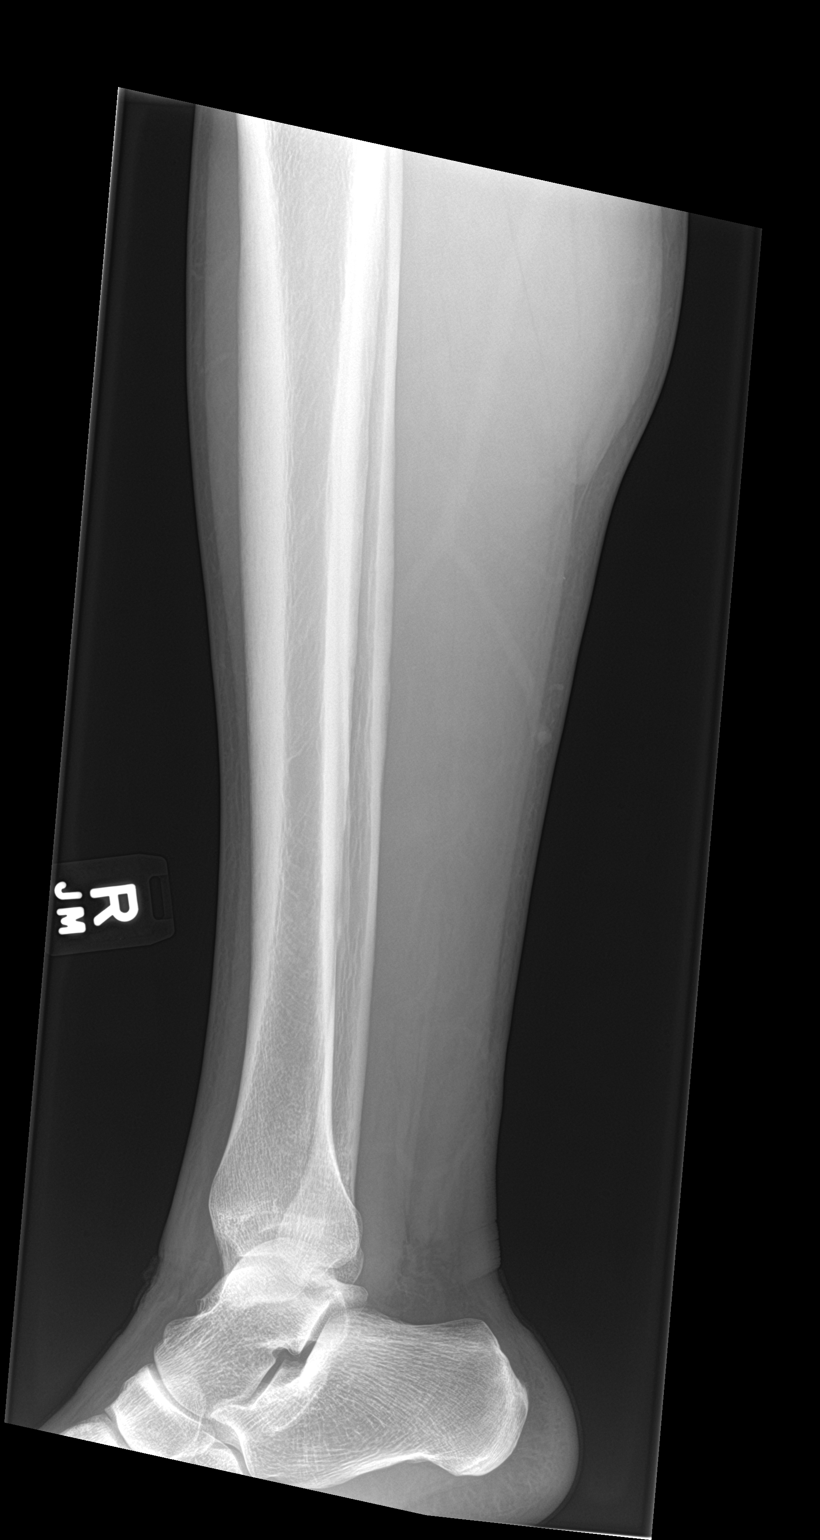

[tibia lat (2 of 2)]
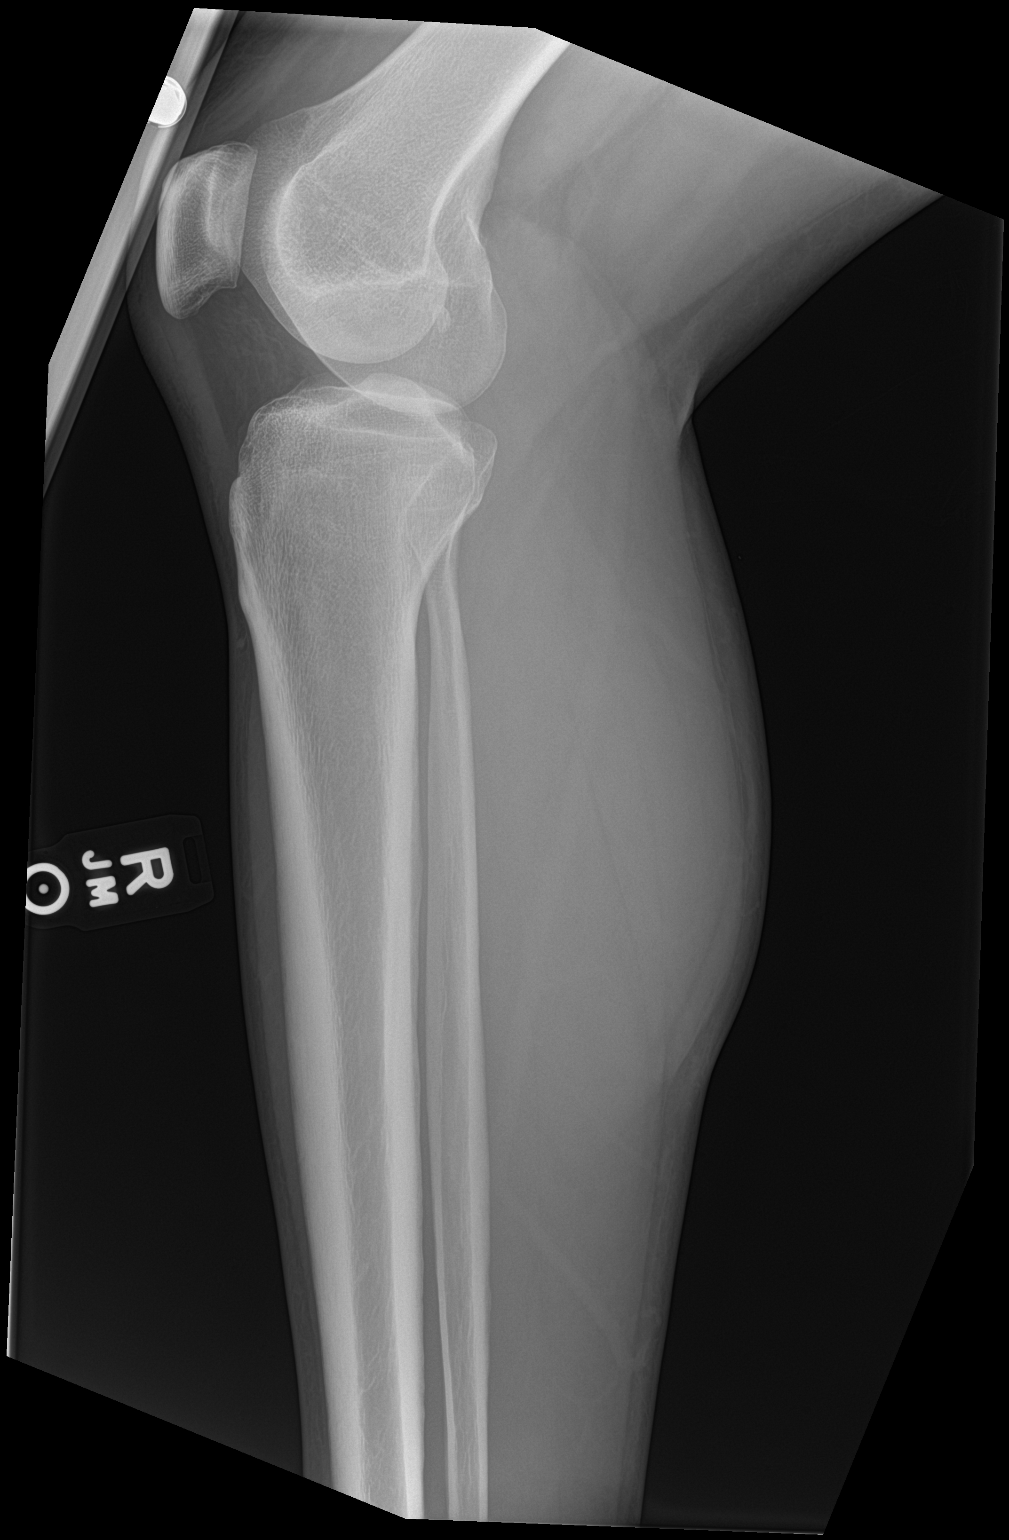

[4 of 4 positions shown; findings below may reference images not displayed]

FINDINGS: There is no evidence of fracture or other focal bone lesions. Soft
tissues are unremarkable. No periosteal reaction or bony erosion. No
radiopaque foreign body.
IMPRESSION: Negative.
